# Patient Record
Sex: Male | Born: 1935 | Race: White | Hispanic: No | Smoking: Former smoker
Health system: Southern US, Community
[De-identification: ages and names within clinical notes are randomized; demographics above are authoritative.]

## PROBLEM LIST (undated history)

## (undated) DIAGNOSIS — C61 Malignant neoplasm of prostate: Secondary | ICD-10-CM

## (undated) DIAGNOSIS — I2510 Atherosclerotic heart disease of native coronary artery without angina pectoris: Secondary | ICD-10-CM

## (undated) DIAGNOSIS — C189 Malignant neoplasm of colon, unspecified: Secondary | ICD-10-CM

## (undated) DIAGNOSIS — I4891 Unspecified atrial fibrillation: Secondary | ICD-10-CM

## (undated) DIAGNOSIS — M48062 Spinal stenosis, lumbar region with neurogenic claudication: Secondary | ICD-10-CM

## (undated) DIAGNOSIS — M4722 Other spondylosis with radiculopathy, cervical region: Secondary | ICD-10-CM

## (undated) DIAGNOSIS — C642 Malignant neoplasm of left kidney, except renal pelvis: Secondary | ICD-10-CM

## (undated) DIAGNOSIS — M4712 Other spondylosis with myelopathy, cervical region: Secondary | ICD-10-CM

## (undated) DIAGNOSIS — I10 Essential (primary) hypertension: Secondary | ICD-10-CM

## (undated) HISTORY — PX: COLECTOMY: SHX59

## (undated) HISTORY — PX: HERNIA REPAIR: SHX51

## (undated) HISTORY — PX: CORONARY ARTERY BYPASS GRAFT: SHX141

## (undated) HISTORY — PX: NASAL SINUS SURGERY: SHX719

---

## 1989-10-30 DIAGNOSIS — C189 Malignant neoplasm of colon, unspecified: Secondary | ICD-10-CM

## 1989-10-30 HISTORY — DX: Malignant neoplasm of colon, unspecified: C18.9

## 2015-12-29 DIAGNOSIS — C642 Malignant neoplasm of left kidney, except renal pelvis: Secondary | ICD-10-CM | POA: Insufficient documentation

## 2015-12-29 HISTORY — DX: Malignant neoplasm of left kidney, except renal pelvis: C64.2

## 2016-01-25 HISTORY — PX: PARTIAL NEPHRECTOMY: SHX414

## 2016-06-01 DIAGNOSIS — R200 Anesthesia of skin: Secondary | ICD-10-CM | POA: Insufficient documentation

## 2016-06-27 DIAGNOSIS — M48062 Spinal stenosis, lumbar region with neurogenic claudication: Secondary | ICD-10-CM

## 2016-06-27 HISTORY — DX: Spinal stenosis, lumbar region with neurogenic claudication: M48.062

## 2016-07-12 DIAGNOSIS — M4712 Other spondylosis with myelopathy, cervical region: Secondary | ICD-10-CM

## 2016-07-12 DIAGNOSIS — M4722 Other spondylosis with radiculopathy, cervical region: Secondary | ICD-10-CM

## 2016-07-12 HISTORY — PX: POSTERIOR CERVICAL LAMINECTOMY: SHX2248

## 2016-07-12 HISTORY — DX: Other spondylosis with myelopathy, cervical region: M47.12

## 2016-07-12 HISTORY — PX: CERVICAL FUSION: SHX112

## 2016-07-14 DIAGNOSIS — I25810 Atherosclerosis of coronary artery bypass graft(s) without angina pectoris: Secondary | ICD-10-CM | POA: Insufficient documentation

## 2016-07-14 DIAGNOSIS — R1312 Dysphagia, oropharyngeal phase: Secondary | ICD-10-CM | POA: Insufficient documentation

## 2016-07-23 ENCOUNTER — Inpatient Hospital Stay (HOSPITAL_COMMUNITY): Payer: Medicare Other

## 2016-07-23 ENCOUNTER — Inpatient Hospital Stay (HOSPITAL_BASED_OUTPATIENT_CLINIC_OR_DEPARTMENT_OTHER)
Admission: EM | Admit: 2016-07-23 | Discharge: 2016-07-27 | DRG: 389 | Disposition: A | Discharge status: Home Health | Payer: Medicare Other | Attending: Nephrology | Admitting: Nephrology

## 2016-07-23 ENCOUNTER — Emergency Department (HOSPITAL_BASED_OUTPATIENT_CLINIC_OR_DEPARTMENT_OTHER): Payer: Medicare Other

## 2016-07-23 ENCOUNTER — Encounter (HOSPITAL_BASED_OUTPATIENT_CLINIC_OR_DEPARTMENT_OTHER): Payer: Self-pay | Admitting: Emergency Medicine

## 2016-07-23 DIAGNOSIS — C642 Malignant neoplasm of left kidney, except renal pelvis: Secondary | ICD-10-CM

## 2016-07-23 DIAGNOSIS — I482 Chronic atrial fibrillation, unspecified: Secondary | ICD-10-CM

## 2016-07-23 DIAGNOSIS — Z7982 Long term (current) use of aspirin: Secondary | ICD-10-CM | POA: Diagnosis not present

## 2016-07-23 DIAGNOSIS — Z85528 Personal history of other malignant neoplasm of kidney: Secondary | ICD-10-CM

## 2016-07-23 DIAGNOSIS — Z85038 Personal history of other malignant neoplasm of large intestine: Secondary | ICD-10-CM | POA: Diagnosis not present

## 2016-07-23 DIAGNOSIS — Z8546 Personal history of malignant neoplasm of prostate: Secondary | ICD-10-CM | POA: Diagnosis not present

## 2016-07-23 DIAGNOSIS — Z87891 Personal history of nicotine dependence: Secondary | ICD-10-CM | POA: Diagnosis not present

## 2016-07-23 DIAGNOSIS — I10 Essential (primary) hypertension: Secondary | ICD-10-CM | POA: Diagnosis present

## 2016-07-23 DIAGNOSIS — I480 Paroxysmal atrial fibrillation: Secondary | ICD-10-CM | POA: Diagnosis present

## 2016-07-23 DIAGNOSIS — E860 Dehydration: Secondary | ICD-10-CM | POA: Diagnosis present

## 2016-07-23 DIAGNOSIS — K565 Intestinal adhesions [bands] with obstruction (postprocedural) (postinfection): Principal | ICD-10-CM | POA: Diagnosis present

## 2016-07-23 DIAGNOSIS — Z79899 Other long term (current) drug therapy: Secondary | ICD-10-CM

## 2016-07-23 DIAGNOSIS — N179 Acute kidney failure, unspecified: Secondary | ICD-10-CM | POA: Diagnosis present

## 2016-07-23 DIAGNOSIS — I2510 Atherosclerotic heart disease of native coronary artery without angina pectoris: Secondary | ICD-10-CM | POA: Insufficient documentation

## 2016-07-23 DIAGNOSIS — K56609 Unspecified intestinal obstruction, unspecified as to partial versus complete obstruction: Secondary | ICD-10-CM

## 2016-07-23 DIAGNOSIS — K8020 Calculus of gallbladder without cholecystitis without obstruction: Secondary | ICD-10-CM | POA: Insufficient documentation

## 2016-07-23 DIAGNOSIS — K5669 Other intestinal obstruction: Secondary | ICD-10-CM | POA: Diagnosis not present

## 2016-07-23 DIAGNOSIS — Z0189 Encounter for other specified special examinations: Secondary | ICD-10-CM

## 2016-07-23 DIAGNOSIS — Z951 Presence of aortocoronary bypass graft: Secondary | ICD-10-CM

## 2016-07-23 DIAGNOSIS — R112 Nausea with vomiting, unspecified: Secondary | ICD-10-CM | POA: Diagnosis present

## 2016-07-23 DIAGNOSIS — Z981 Arthrodesis status: Secondary | ICD-10-CM | POA: Diagnosis not present

## 2016-07-23 DIAGNOSIS — R262 Difficulty in walking, not elsewhere classified: Secondary | ICD-10-CM

## 2016-07-23 HISTORY — DX: Other spondylosis with radiculopathy, cervical region: M47.22

## 2016-07-23 HISTORY — DX: Other spondylosis with myelopathy, cervical region: M47.12

## 2016-07-23 HISTORY — DX: Spinal stenosis, lumbar region with neurogenic claudication: M48.062

## 2016-07-23 HISTORY — DX: Malignant neoplasm of left kidney, except renal pelvis: C64.2

## 2016-07-23 HISTORY — DX: Malignant neoplasm of prostate: C61

## 2016-07-23 HISTORY — DX: Unspecified atrial fibrillation: I48.91

## 2016-07-23 HISTORY — DX: Essential (primary) hypertension: I10

## 2016-07-23 HISTORY — DX: Malignant neoplasm of colon, unspecified: C18.9

## 2016-07-23 HISTORY — DX: Atherosclerotic heart disease of native coronary artery without angina pectoris: I25.10

## 2016-07-23 LAB — COMPREHENSIVE METABOLIC PANEL
ALBUMIN: 2.9 g/dL — AB (ref 3.5–5.0)
ALK PHOS: 100 U/L (ref 38–126)
ALT: 51 U/L (ref 17–63)
ANION GAP: 13 (ref 5–15)
AST: 49 U/L — ABNORMAL HIGH (ref 15–41)
BILIRUBIN TOTAL: 0.8 mg/dL (ref 0.3–1.2)
BUN: 30 mg/dL — ABNORMAL HIGH (ref 6–20)
CALCIUM: 8.9 mg/dL (ref 8.9–10.3)
CO2: 23 mmol/L (ref 22–32)
Chloride: 97 mmol/L — ABNORMAL LOW (ref 101–111)
Creatinine, Ser: 1.62 mg/dL — ABNORMAL HIGH (ref 0.61–1.24)
GFR calc Af Amer: 45 mL/min — ABNORMAL LOW (ref >60)
GFR calc non Af Amer: 39 mL/min — ABNORMAL LOW (ref >60)
GLUCOSE: 116 mg/dL — AB (ref 65–99)
Potassium: 4.8 mmol/L (ref 3.5–5.1)
Sodium: 133 mmol/L — ABNORMAL LOW (ref 135–145)
TOTAL PROTEIN: 6.7 g/dL (ref 6.5–8.1)

## 2016-07-23 LAB — CBC WITH DIFFERENTIAL/PLATELET
BASOS ABS: 0 10*3/uL (ref 0.0–0.1)
BASOS PCT: 0 %
EOS PCT: 0 %
Eosinophils Absolute: 0 10*3/uL (ref 0.0–0.7)
HEMATOCRIT: 31.4 % — AB (ref 39.0–52.0)
Hemoglobin: 10.2 g/dL — ABNORMAL LOW (ref 13.0–17.0)
Lymphocytes Relative: 4 %
Lymphs Abs: 0.6 10*3/uL — ABNORMAL LOW (ref 0.7–4.0)
MCH: 26.5 pg (ref 26.0–34.0)
MCHC: 32.5 g/dL (ref 30.0–36.0)
MCV: 81.6 fL (ref 78.0–100.0)
MONO ABS: 0.9 10*3/uL (ref 0.1–1.0)
Monocytes Relative: 6 %
NEUTROS ABS: 13.6 10*3/uL — AB (ref 1.7–7.7)
Neutrophils Relative %: 90 %
PLATELETS: 550 10*3/uL — AB (ref 150–400)
RBC: 3.85 MIL/uL — ABNORMAL LOW (ref 4.22–5.81)
RDW: 15.7 % — AB (ref 11.5–15.5)
WBC: 15.1 10*3/uL — ABNORMAL HIGH (ref 4.0–10.5)

## 2016-07-23 LAB — LIPASE, BLOOD: Lipase: 29 U/L (ref 11–51)

## 2016-07-23 MED ORDER — SODIUM CHLORIDE 0.9 % IV SOLN
8.0000 mg | Freq: Four times a day (QID) | INTRAVENOUS | Status: DC | PRN
  Filled 2016-07-23: qty 4

## 2016-07-23 MED ORDER — ACETAMINOPHEN 650 MG RE SUPP
650.0000 mg | Freq: Four times a day (QID) | RECTAL | Status: DC | PRN
  Administered 2016-07-23 – 2016-07-25 (×2): 650 mg via RECTAL
  Filled 2016-07-23 (×3): qty 1

## 2016-07-23 MED ORDER — METOPROLOL TARTRATE 5 MG/5ML IV SOLN: 5.0000 mg | Freq: Four times a day (QID) | INTRAVENOUS | Status: DC | PRN

## 2016-07-23 MED ORDER — LIP MEDEX EX OINT
1.0000 "application " | TOPICAL_OINTMENT | Freq: Two times a day (BID) | CUTANEOUS | Status: DC
  Administered 2016-07-23 – 2016-07-27 (×6): 1 via TOPICAL
  Filled 2016-07-23: qty 7

## 2016-07-23 MED ORDER — BISACODYL 10 MG RE SUPP: 10.0000 mg | Freq: Two times a day (BID) | RECTAL | Status: DC | PRN

## 2016-07-23 MED ORDER — PHENOL 1.4 % MT LIQD: 2.0000 | OROMUCOSAL | Status: DC | PRN

## 2016-07-23 MED ORDER — SODIUM CHLORIDE 0.9 % IV SOLN
INTRAVENOUS | Status: AC
  Administered 2016-07-23: 16:00:00 via INTRAVENOUS

## 2016-07-23 MED ORDER — SODIUM CHLORIDE 0.9 % IV SOLN
Freq: Once | INTRAVENOUS | Status: AC
  Administered 2016-07-23: 12:00:00 via INTRAVENOUS

## 2016-07-23 MED ORDER — LACTATED RINGERS IV BOLUS (SEPSIS): 1000.0000 mL | Freq: Three times a day (TID) | INTRAVENOUS | Status: AC | PRN

## 2016-07-23 MED ORDER — PROCHLORPERAZINE EDISYLATE 5 MG/ML IJ SOLN: 5.0000 mg | INTRAMUSCULAR | Status: DC | PRN

## 2016-07-23 MED ORDER — MENTHOL 3 MG MT LOZG: 1.0000 | LOZENGE | OROMUCOSAL | Status: DC | PRN

## 2016-07-23 MED ORDER — IOPAMIDOL (ISOVUE-300) INJECTION 61%
75.0000 mL | Freq: Once | INTRAVENOUS | Status: AC | PRN
  Administered 2016-07-23: 75 mL via INTRAVENOUS

## 2016-07-23 MED ORDER — MAGIC MOUTHWASH
15.0000 mL | Freq: Four times a day (QID) | ORAL | Status: DC | PRN
  Filled 2016-07-23: qty 15

## 2016-07-23 MED ORDER — DIATRIZOATE MEGLUMINE & SODIUM 66-10 % PO SOLN
90.0000 mL | Freq: Once | ORAL | Status: AC
  Administered 2016-07-23: 90 mL via NASOGASTRIC
  Filled 2016-07-23: qty 90

## 2016-07-23 MED ORDER — ENALAPRILAT 1.25 MG/ML IV SOLN
0.6250 mg | Freq: Four times a day (QID) | INTRAVENOUS | Status: DC | PRN
  Filled 2016-07-23: qty 1

## 2016-07-23 MED ORDER — SODIUM CHLORIDE 0.9 % IV BOLUS (SEPSIS)
1000.0000 mL | Freq: Once | INTRAVENOUS | Status: AC
  Administered 2016-07-23: 1000 mL via INTRAVENOUS

## 2016-07-23 MED ORDER — FENTANYL CITRATE (PF) 100 MCG/2ML IJ SOLN
12.5000 ug | Freq: Once | INTRAMUSCULAR | Status: AC
  Administered 2016-07-23: 100 ug via INTRAVENOUS
  Filled 2016-07-23: qty 2

## 2016-07-23 MED ORDER — METOPROLOL TARTRATE 5 MG/5ML IV SOLN
5.0000 mg | Freq: Four times a day (QID) | INTRAVENOUS | Status: DC
  Administered 2016-07-23 – 2016-07-24 (×3): 5 mg via INTRAVENOUS
  Filled 2016-07-23 (×3): qty 5

## 2016-07-23 MED ORDER — ONDANSETRON HCL 4 MG/2ML IJ SOLN
4.0000 mg | Freq: Four times a day (QID) | INTRAMUSCULAR | Status: DC | PRN
  Administered 2016-07-25: 4 mg via INTRAVENOUS
  Filled 2016-07-23: qty 2

## 2016-07-23 MED ORDER — ONDANSETRON HCL 4 MG/2ML IJ SOLN: 4.0000 mg | Freq: Three times a day (TID) | INTRAMUSCULAR | Status: DC | PRN

## 2016-07-23 MED ORDER — ENOXAPARIN SODIUM 40 MG/0.4ML ~~LOC~~ SOLN
40.0000 mg | SUBCUTANEOUS | Status: DC
  Administered 2016-07-23 – 2016-07-26 (×4): 40 mg via SUBCUTANEOUS
  Filled 2016-07-23 (×4): qty 0.4

## 2016-07-23 MED ORDER — ONDANSETRON HCL 4 MG/2ML IJ SOLN
4.0000 mg | Freq: Once | INTRAMUSCULAR | Status: AC
  Administered 2016-07-23: 4 mg via INTRAVENOUS
  Filled 2016-07-23: qty 2

## 2016-07-23 MED ORDER — FENTANYL CITRATE (PF) 100 MCG/2ML IJ SOLN
12.5000 ug | INTRAMUSCULAR | Status: DC | PRN
  Administered 2016-07-26: 12.5 ug via INTRAVENOUS
  Filled 2016-07-23: qty 2

## 2016-07-23 MED ORDER — LACTATED RINGERS IV SOLN
INTRAVENOUS | Status: DC
  Administered 2016-07-23: via INTRAVENOUS

## 2016-07-23 MED ORDER — ALUM & MAG HYDROXIDE-SIMETH 200-200-20 MG/5ML PO SUSP: 30.0000 mL | Freq: Four times a day (QID) | ORAL | Status: DC | PRN

## 2016-07-23 MED ORDER — METHOCARBAMOL 1000 MG/10ML IJ SOLN
1000.0000 mg | Freq: Four times a day (QID) | INTRAVENOUS | Status: DC | PRN
  Administered 2016-07-25: 1000 mg via INTRAVENOUS
  Filled 2016-07-23 (×4): qty 10

## 2016-07-23 MED ORDER — FENTANYL CITRATE (PF) 100 MCG/2ML IJ SOLN: 50.0000 ug | Freq: Once | INTRAMUSCULAR | Status: DC

## 2016-07-23 MED ORDER — DIPHENHYDRAMINE HCL 50 MG/ML IJ SOLN
12.5000 mg | Freq: Four times a day (QID) | INTRAMUSCULAR | Status: DC | PRN
  Administered 2016-07-24: 25 mg via INTRAVENOUS
  Filled 2016-07-23: qty 1

## 2016-07-23 NOTE — Progress Notes (Signed)
Transfer from [place]  Patient is a 80 year old male with a history of colon cancer, prostate cancer, paroxysmal atrial fibrillation, CAD found to have small bowel obstruction likely secondary to adhesions. NG tube placed in the ED. Dr. gross, general surgery, has been consulted and will see on arrival to [facility] once paged. Continue nothing by mouth and NG tube. Admit to medical floor.   Cordelia Poche, MD [facility] 07/23/2016, 1:10 PM Pager: [phone]

## 2016-07-23 NOTE — H&P (Signed)
History and Physical    Ricky Pierce [id number] DOB: 12/27/1935 DOA: 07/23/2016  PCP: Pcp Not In System Patient coming from: Home  Chief Complaint: Nausea and vomiting  HPI: Ricky Pierce is a 80 y.o. male with medical history significant of paroxysmal atrial fibrillation, hypertension, hx of colon cancer, hx of prostate cancer, hx of renal cell carcinoma, CAD s/p CABG. Patient reports a one day history of severe vomiting. Symptoms worsened overnight into this morning. Emesis is bilious and non-bloody with fecal matter. There are no triggers to the emesis. He has not tried anything for his vomiting. Last bowel movement two days ago. He is passing gas.  ED Course: Vitals: Tachycardia, afebrile Labs: Elevated creatinine of 1.62. Elevated WBC of 15.1k Imaging: CT significant for small bowel obstruction, cholelithiasis and aortoiliac atherosclerosis Medications/Course: Fentanyl for pain  Review of Systems: Review of Systems  Cardiovascular: Negative for chest pain, palpitations and orthopnea.  Gastrointestinal: Positive for vomiting. Negative for abdominal pain, blood in stool, constipation, diarrhea and nausea.  Genitourinary: Negative for dysuria, frequency, hematuria and urgency.  Musculoskeletal: Positive for neck pain.    Past Medical History:  Diagnosis Date  . Atrial fibrillation ([place])   . Cervical spondylosis with myelopathy and radiculopathy 07/12/2016   Last Assessment & Plan:  Cervical spondylosis with myelopathy and radiculopathy. Surgery on 07/12/16 by Dr. Guinevere Ferrari went well.  Plan: Improving but needs rehab.  PRN pain medication.  . Colon cancer (Dx 1991) 1991   proximal "right" side  . Coronary artery disease   . Hypertension   . Prostate cancer ([place])   . Renal cell carcinoma of left kidney 12/2015   left partial nephrectomy  . Spinal stenosis of lumbar region with neurogenic claudication 06/27/2016   Last Assessment & Plan:  L3-4 and L4-5 spinal stenosis most severe at  L4-5.  This is due to underlying degenerative disc disease and spondylosis with facet arthropathy and ligamentum flavum hypertrophy.  He will likely need a two-level L3 and L4 laminectomy in the future to decompress the spinal axis here.  I have advised him to undergo the cervical procedure first and when he is cleared from that then we will bring him back for his lumbar decompression.    Past Surgical History:  Procedure Laterality Date  . CERVICAL FUSION  07/12/2016   [place], [place].  Dr Guinevere Ferrari.  STAGE 1: Cervical 3-4, 4-5, 5-6 and 6-7 Anterior Cervical Diskectomy Fusion Globus XTEND Cervical Plate; STAGE 2: Cervical 3-7 Laminectomies with Posterior Lateral Fusion with Instrumentation. Globus Quartex and Kinex  . COLECTOMY Right    colon cancer proximal/"right"  . CORONARY ARTERY BYPASS GRAFT    . HERNIA REPAIR    . NASAL SINUS SURGERY    . PARTIAL NEPHRECTOMY Left 01/25/2016  . POSTERIOR CERVICAL LAMINECTOMY  07/12/2016   2 steel rods and a plate     reports that he has quit smoking. His smoking use included Cigarettes. He has never used smokeless tobacco. He reports that he drinks about 1.2 - 1.8 oz of alcohol per week . He reports that he does not use drugs.  Allergies  Allergen Reactions  . Sulfa Antibiotics Hives    Family History  Problem Relation Age of Onset  . Heart attack Mother   . Cancer Father   . Heart attack Father   . Cancer Sister   . Cancer Sister     Prior to Admission medications   Medication Sig Start Date End Date Taking? Authorizing Provider  aspirin 81 MG  chewable tablet Chew by mouth daily.   Yes Historical Provider, MD  cyclobenzaprine (FLEXERIL) 10 MG tablet Take 10 mg by mouth 2 (two) times daily.    Yes Historical Provider, MD  fexofenadine (ALLEGRA) 180 MG tablet Take 180 mg by mouth daily.   Yes Historical Provider, MD  lisinopril (PRINIVIL,ZESTRIL) 10 MG tablet Take 10 mg by mouth daily.   Yes Historical Provider, MD  metoprolol succinate  (TOPROL-XL) 50 MG 24 hr tablet Take 50 mg by mouth 2 (two) times daily. Take with or immediately following a meal.   Yes Historical Provider, MD  montelukast (SINGULAIR) 10 MG tablet Take 10 mg by mouth at bedtime.   Yes Historical Provider, MD  omeprazole (PRILOSEC) 20 MG capsule Take 20 mg by mouth 2 (two) times daily before a meal.   Yes Historical Provider, MD  oxyCODONE-acetaminophen (PERCOCET/ROXICET) 5-325 MG tablet Take by mouth every 8 (eight) hours as needed for severe pain.   Yes Historical Provider, MD  rosuvastatin (CRESTOR) 40 MG tablet Take 40 mg by mouth daily.   Yes Historical Provider, MD    Physical Exam: Vitals:   07/23/16 1436 07/23/16 1530 07/23/16 1801 07/23/16 1852  BP: 127/56 (!) 118/55  (!) 138/46  Pulse: (!) 121 (!) 125  (!) 119  Resp: 18 16  18   Temp: 98.5 F (36.9 C) 97.7 F (36.5 C)  98.5 F (36.9 C)  TempSrc: Oral Oral  Oral  SpO2: 100% 100%  99%  Weight:   70.5 kg (155 lb 6.8 oz)   Height:   5\' 4"  (1.626 m)      Constitutional: NAD, calm, comfortable Eyes: PERRL, lids and conjunctivae normal ENMT: Mucous membranes are moist. Posterior pharynx clear of any exudate or lesions. Neck collar Neck: normal, supple, no masses Respiratory: clear to auscultation bilaterally, no wheezing, no crackles. Normal respiratory effort. No accessory muscle use.  Cardiovascular: Increased rate and regular rhythm, no murmurs / rubs / gallops. No extremity edema. 2+ pedal pulses. No carotid bruits.  Abdomen: no tenderness, no masses palpated. Mild distention but soft. No hepatosplenomegaly. Bowel sounds positive.  Musculoskeletal: no clubbing / cyanosis. No joint deformity upper and lower extremities. Good ROM, no contractures. Normal muscle tone.  Skin: no rashes, lesions, ulcers. No induration Neurologic: CN 2-12 grossly intact. Sensation intact, DTR normal. Strength 5/5 in all 4.  Psychiatric: Normal judgment and insight. Alert and oriented x 3. Normal mood.   Labs on  Admission: I have personally reviewed following labs and imaging studies  CBC:  Recent Labs Lab 07/23/16 0830  WBC 15.1*  NEUTROABS 13.6*  HGB 10.2*  HCT 31.4*  MCV 81.6  PLT [id number]*   Basic Metabolic Panel:  Recent Labs Lab 07/23/16 0830  NA 133*  K 4.8  CL 97*  CO2 23  GLUCOSE 116*  BUN 30*  CREATININE 1.62*  CALCIUM 8.9   GFR: Estimated Creatinine Clearance: 31.5 mL/min (by C-G formula based on SCr of 1.62 mg/dL (H)). Liver Function Tests:  Recent Labs Lab 07/23/16 0830  AST 49*  ALT 51  ALKPHOS 100  BILITOT 0.8  PROT 6.7  ALBUMIN 2.9*    Recent Labs Lab 07/23/16 0830  LIPASE 29   Radiological Exams on Admission: Ct Abdomen Pelvis W Contrast  Result Date: 07/23/2016 CLINICAL DATA:  N/V 2300 last pm, had AAS today showing possible obstruction, last bm 2 days ago, recent cervical spine surgery, pt in cervical collar, unable to raise arms d/t surgical restrictionsReduced dose per protocol, pt  with partial left nephrectomy 01/25/16 for renal cancer, followed by nephrologistHx prostate cancer, colon cancer and renal cancer, colectomy EXAM: CT ABDOMEN AND PELVIS WITH CONTRAST TECHNIQUE: Multidetector CT imaging of the abdomen and pelvis was performed using the standard protocol following bolus administration of intravenous contrast. CONTRAST:  46mL ISOVUE-300 IOPAMIDOL (ISOVUE-300) INJECTION 61% COMPARISON:  Radiographs from earlier the same day FINDINGS: Lower chest: Previous median sternotomy. Coronary calcifications. No pleural or pericardial effusion. Subpleural sub cm nodular opacities posteriorly in the visualized right lung base. Elevated left diaphragmatic leaflet with some adjacent subsegmental atelectasis at the left lung base. Hepatobiliary: Negative liver. Sub cm soft tissue attenuation density in the dependent aspect of the gallbladder lumen probably small calculi. Pancreas: Unremarkable. No pancreatic ductal dilatation or surrounding inflammatory changes.  Spleen: Normal in size without focal abnormality. Adrenals/Urinary Tract: Normal adrenals. Normal right kidney. Focal parenchymal loss in the lower pole left kidney. No solid renal mass or hydronephrosis. No urolithiasis. Urinary bladder physiologically distended containing small amount of gas presumably from recent instrumentation. Stomach/Bowel: Stomach is mildly distended. Dilated proximal and mid small bowel loops without wall thickening. Abrupt transition to decompressed ileum in the right lower quadrant, with no associated mass or abscess. Distal ileum unremarkable. Probable partial right hemicolectomy. The colon is nondilated. Vascular/Lymphatic: Coarse aortoiliac arterial calcifications without aneurysm. No adenopathy localized. Reproductive: Mild prostatic enlargement with central coarse calcifications. Other: No ascites.  No free air. Musculoskeletal: No acute or significant osseous findings. IMPRESSION: 1. Distal small bowel obstruction without associated wall thickening, mass, hernia, or abscess suggesting adhesions as etiology. 2. Cholelithiasis 3. Aortoiliac atherosclerosis. Electronically Signed   By: Lucrezia Europe M.D.   On: 07/23/2016 10:22   Dg Abd Acute W/chest  Result Date: 07/23/2016 CLINICAL DATA:  Nausea/vomiting EXAM: DG ABDOMEN ACUTE W/ 1V CHEST COMPARISON:  Chest radiographs dated 03/01/2016 FINDINGS: Lungs are clear.  No pleural effusion or pneumothorax. Mild elevation left hemidiaphragm. The heart is normal in size. Postsurgical changes related to prior CABG. Median sternotomy. Multiple dilated loops of small bowel in the left mid abdomen, suspicious for small bowel obstruction. Surgical sutures in the right mid abdomen. No evidence of free air under the diaphragm on the upright view. Mild degenerative changes of the lumbar spine. IMPRESSION: No evidence of acute cardiopulmonary disease. Multiple dilated loops of small bowel in the left mid abdomen, suspicious for small bowel  obstruction. No free air. Electronically Signed   By: Julian Hy M.D.   On: 07/23/2016 09:26   Dg Abd Portable 1v-small Bowel Protocol-position Verification  Result Date: 07/23/2016 CLINICAL DATA:  Nasogastric tube placement.  Abdominal distention EXAM: PORTABLE ABDOMEN - 1 VIEW COMPARISON:  CT abdomen and pelvis earlier in the day FINDINGS: Nasogastric tube tip and side port are in the stomach. There are multiple loops of dilated bowel consistent with a degree of bowel obstruction. There are surgical clips the right abdomen. No free air evident. Contrast is seen in the urinary bladder. IMPRESSION: Persistent bowel dilatation in a pattern suggesting a degree of bowel obstruction. No free air. Nasogastric tube tip and side port in stomach. Note elevation of the left hemidiaphragm. Electronically Signed   By: Lowella Grip III M.D.   On: 07/23/2016 17:20    Assessment/Plan Principal Problem:   SBO (small bowel obstruction) (HCC) Active Problems:   Hypertension   Paroxysmal atrial fibrillation with RVR (HCC)   ARF (acute renal failure)    Small bowel obstruction Likely secondary to adhesions from multiple intraabdominal surgeries -NG tube -  NPO -surgery recommendations  Acute renal injury Likely secondary to dehydration -IVF -recheck BMP in AM  Atrial fibrillation Tachycardia Patient is tachycardic. Regular rhythm on exam -EKG -metoprolol IV while NPO  Hypertension -metoprolol IV while NPO  CAD Hold statin and aspirin while NPO  DVT prophylaxis: Lovenox Code Status: Full code Family Communication: Wife at bedside Disposition Plan: Likely discharge in 2-3 days Consults called: General surgery, Dr. Johney Maine Admission status: Inpatient, medical floor   Cordelia Poche, MD [facility] Pager [phone]  If 7PM-7AM, please contact night-coverage [email or web address] Password [email or web address]  07/23/2016, 7:13 PM

## 2016-07-23 NOTE — ED Provider Notes (Signed)
[facility] DEPT [facility] Provider Note   CSN: [id number] Arrival date & time: 07/23/16  0741     History   Chief Complaint Chief Complaint  Patient presents with  . Emesis  . Fall    HPI Ricky Pierce is a 80 y.o. male.  HPI Patient presents with nausea and vomiting that started around 11 PM last night.  Has vomited many times.  Describes emesis is brown.  Patient also noted some distention of his abdomen.  His recently been placed on oxycodone.  Patient had Past Medical History:  Diagnosis Date  . Atrial fibrillation ([place])   . Cervical spondylosis with myelopathy and radiculopathy 07/12/2016   Last Assessment & Plan:  Cervical spondylosis with myelopathy and radiculopathy. Surgery on 07/12/16 by Dr. Guinevere Ferrari went well.  Plan: Improving but needs rehab.  PRN pain medication.  . Colon cancer (Dx 1991) 1991   proximal "right" side  . Coronary artery disease   . Hypertension   . Prostate cancer ([place])   . Renal cell carcinoma of left kidney 12/2015   left partial nephrectomy  . Spinal stenosis of lumbar region with neurogenic claudication 06/27/2016   Last Assessment & Plan:  L3-4 and L4-5 spinal stenosis most severe at L4-5.  This is due to underlying degenerative disc disease and spondylosis with facet arthropathy and ligamentum flavum hypertrophy.  He will likely need a two-level L3 and L4 laminectomy in the future to decompress the spinal axis here.  I have advised him to undergo the cervical procedure first and when he is cleared from that then we will bring him back for his lumbar decompression.    Patient Active Problem List   Diagnosis Date Noted  . SBO (small bowel obstruction) ([place]) 07/23/2016  . ARF (acute renal failure)  07/23/2016  . Cholelithiasis 07/23/2016  . Hypertension   . Coronary artery disease   . Paroxysmal atrial fibrillation with RVR ([place])   . Coronary artery disease involving coronary bypass graft of native heart without angina pectoris 07/14/2016  .  Oropharyngeal dysphagia 07/14/2016  . Cervical spondylosis with myelopathy and radiculopathy 07/12/2016  . Spinal stenosis of lumbar region with neurogenic claudication 06/27/2016  . Leg numbness 06/01/2016  . Renal cell carcinoma of left kidney 12/29/2015  . Colon cancer (Dx 1991) 10/30/1989    Past Surgical History:  Procedure Laterality Date  . CERVICAL FUSION  07/12/2016   [place], [place].  Dr Guinevere Ferrari.  STAGE 1: Cervical 3-4, 4-5, 5-6 and 6-7 Anterior Cervical Diskectomy Fusion Globus XTEND Cervical Plate; STAGE 2: Cervical 3-7 Laminectomies with Posterior Lateral Fusion with Instrumentation. Globus Quartex and Kinex  . COLECTOMY Right    colon cancer proximal/"right"  . CORONARY ARTERY BYPASS GRAFT    . HERNIA REPAIR    . NASAL SINUS SURGERY    . PARTIAL NEPHRECTOMY Left 01/25/2016  . POSTERIOR CERVICAL LAMINECTOMY  07/12/2016   2 steel rods and a plate       Home Medications    Prior to Admission medications   Medication Sig Start Date End Date Taking? Authorizing Provider  aspirin 81 MG chewable tablet Chew by mouth daily.   Yes Historical Provider, MD  cyclobenzaprine (FLEXERIL) 10 MG tablet Take 10 mg by mouth 2 (two) times daily.    Yes Historical Provider, MD  fexofenadine (ALLEGRA) 180 MG tablet Take 180 mg by mouth daily.   Yes Historical Provider, MD  lisinopril (PRINIVIL,ZESTRIL) 10 MG tablet Take 10 mg by mouth daily.   Yes Historical Provider, MD  metoprolol succinate (TOPROL-XL) 50 MG 24 hr tablet Take 50 mg by mouth 2 (two) times daily. Take with or immediately following a meal.   Yes Historical Provider, MD  montelukast (SINGULAIR) 10 MG tablet Take 10 mg by mouth at bedtime.   Yes Historical Provider, MD  omeprazole (PRILOSEC) 20 MG capsule Take 20 mg by mouth 2 (two) times daily before a meal.   Yes Historical Provider, MD  oxyCODONE-acetaminophen (PERCOCET/ROXICET) 5-325 MG tablet Take by mouth every 8 (eight) hours as needed for severe pain.   Yes Historical  Provider, MD  rosuvastatin (CRESTOR) 40 MG tablet Take 40 mg by mouth daily.   Yes Historical Provider, MD    Family History Family History  Problem Relation Age of Onset  . Heart attack Mother   . Cancer Father   . Heart attack Father   . Cancer Sister   . Cancer Sister     Social History Social History  Substance Use Topics  . Smoking status: Former Smoker    Types: Cigarettes  . Smokeless tobacco: Never Used     Comment: quit smoking 26 yrs ago  . Alcohol use 1.2 - 1.8 oz/week    2 - 3 Cans of beer per week     Comment: only drinks during the summer     Allergies   Sulfa antibiotics   Review of Systems Review of Systems All other systems reviewed and are negative  Physical Exam Updated Vital Signs BP (!) 152/68 (BP Location: Right Arm)   Pulse 96   Temp 97.8 F (36.6 C) (Oral)   Resp 16   Ht 5\' 4"  (1.626 m)   Wt 155 lb 6.8 oz (70.5 kg)   SpO2 100%   BMI 26.68 kg/m   Physical Exam Physical Exam  Nursing note and vitals reviewed. Constitutional: He is oriented to person, place, and time. He appears well-developed and well-nourished. No distress.  HENT:  Head: Normocephalic and atraumatic.  Eyes: Pupils are equal, round, and reactive to light.  Neck: Normal range of motion.  Cardiovascular: Normal rate and intact distal pulses.   Pulmonary/Chest: No respiratory distress.  Abdominal: Normal appearance.  Patient appears distended and has increased percussion.  No focal tenderness.  Bowel sounds are diminished but present.  No rebound or guarding tenderness. Musculoskeletal: Normal range of motion.  Neurological: He is alert and oriented to person, place, and time. No cranial nerve deficit.  Skin: Skin is warm and dry. No rash noted.  Psychiatric: He has a normal mood and affect. His behavior is normal.    ED Treatments / Results  Labs (all labs ordered are listed, but only abnormal results are displayed) Labs Reviewed  COMPREHENSIVE METABOLIC PANEL -  Abnormal; Notable for the following:       Result Value   Sodium 133 (*)    Chloride 97 (*)    Glucose, Bld 116 (*)    BUN 30 (*)    Creatinine, Ser 1.62 (*)    Albumin 2.9 (*)    AST 49 (*)    GFR calc non Af Amer 39 (*)    GFR calc Af Amer 45 (*)    All other components within normal limits  CBC WITH DIFFERENTIAL/PLATELET - Abnormal; Notable for the following:    WBC 15.1 (*)    RBC 3.85 (*)    Hemoglobin 10.2 (*)    HCT 31.4 (*)    RDW 15.7 (*)    Platelets 550 (*)  Neutro Abs 13.6 (*)    Lymphs Abs 0.6 (*)    All other components within normal limits  BASIC METABOLIC PANEL - Abnormal; Notable for the following:    BUN 29 (*)    Creatinine, Ser 1.32 (*)    Calcium 8.6 (*)    GFR calc non Af Amer 50 (*)    GFR calc Af Amer 58 (*)    All other components within normal limits  CBC - Abnormal; Notable for the following:    RBC 3.71 (*)    Hemoglobin 9.7 (*)    HCT 29.3 (*)    RDW 16.1 (*)    Platelets 449 (*)    All other components within normal limits  LIPASE, BLOOD  MAGNESIUM    EKG  EKG Interpretation None       Radiology Ct Abdomen Pelvis W Contrast  Result Date: 07/23/2016 CLINICAL DATA:  N/V 2300 last pm, had AAS today showing possible obstruction, last bm 2 days ago, recent cervical spine surgery, pt in cervical collar, unable to raise arms d/t surgical restrictionsReduced dose per protocol, pt with partial left nephrectomy 01/25/16 for renal cancer, followed by nephrologistHx prostate cancer, colon cancer and renal cancer, colectomy EXAM: CT ABDOMEN AND PELVIS WITH CONTRAST TECHNIQUE: Multidetector CT imaging of the abdomen and pelvis was performed using the standard protocol following bolus administration of intravenous contrast. CONTRAST:  41mL ISOVUE-300 IOPAMIDOL (ISOVUE-300) INJECTION 61% COMPARISON:  Radiographs from earlier the same day FINDINGS: Lower chest: Previous median sternotomy. Coronary calcifications. No pleural or pericardial effusion.  Subpleural sub cm nodular opacities posteriorly in the visualized right lung base. Elevated left diaphragmatic leaflet with some adjacent subsegmental atelectasis at the left lung base. Hepatobiliary: Negative liver. Sub cm soft tissue attenuation density in the dependent aspect of the gallbladder lumen probably small calculi. Pancreas: Unremarkable. No pancreatic ductal dilatation or surrounding inflammatory changes. Spleen: Normal in size without focal abnormality. Adrenals/Urinary Tract: Normal adrenals. Normal right kidney. Focal parenchymal loss in the lower pole left kidney. No solid renal mass or hydronephrosis. No urolithiasis. Urinary bladder physiologically distended containing small amount of gas presumably from recent instrumentation. Stomach/Bowel: Stomach is mildly distended. Dilated proximal and mid small bowel loops without wall thickening. Abrupt transition to decompressed ileum in the right lower quadrant, with no associated mass or abscess. Distal ileum unremarkable. Probable partial right hemicolectomy. The colon is nondilated. Vascular/Lymphatic: Coarse aortoiliac arterial calcifications without aneurysm. No adenopathy localized. Reproductive: Mild prostatic enlargement with central coarse calcifications. Other: No ascites.  No free air. Musculoskeletal: No acute or significant osseous findings. IMPRESSION: 1. Distal small bowel obstruction without associated wall thickening, mass, hernia, or abscess suggesting adhesions as etiology. 2. Cholelithiasis 3. Aortoiliac atherosclerosis. Electronically Signed   By: Lucrezia Europe M.D.   On: 07/23/2016 10:22   Dg Abd Acute W/chest  Result Date: 07/23/2016 CLINICAL DATA:  Nausea/vomiting EXAM: DG ABDOMEN ACUTE W/ 1V CHEST COMPARISON:  Chest radiographs dated 03/01/2016 FINDINGS: Lungs are clear.  No pleural effusion or pneumothorax. Mild elevation left hemidiaphragm. The heart is normal in size. Postsurgical changes related to prior CABG. Median  sternotomy. Multiple dilated loops of small bowel in the left mid abdomen, suspicious for small bowel obstruction. Surgical sutures in the right mid abdomen. No evidence of free air under the diaphragm on the upright view. Mild degenerative changes of the lumbar spine. IMPRESSION: No evidence of acute cardiopulmonary disease. Multiple dilated loops of small bowel in the left mid abdomen, suspicious for small bowel obstruction. No  free air. Electronically Signed   By: Julian Hy M.D.   On: 07/23/2016 09:26   Dg Abd Portable 1v-small Bowel Obstruction Protocol-initial, 8 Hr Delay  Result Date: 07/24/2016 CLINICAL DATA:  8 hours after administration of oral contrast. Small bowel obstruction protocol. Initial encounter. EXAM: PORTABLE ABDOMEN - 1 VIEW COMPARISON:  Abdominal radiograph performed 07/23/2016 FINDINGS: There is dilution of contrast into the small bowel. Contrast has not progressed to the colon, and diffusely dilated small bowel loops are again noted, measuring up to 4.2 cm, compatible with persistent obstruction. Contrast is also noted within the bladder. Postoperative change is seen at the right mid abdomen. No acute osseous abnormalities are seen. IMPRESSION: Dilution of contrast into the small bowel. No evidence of progression of contrast to the colon. Diffusely dilated small bowel loops again noted, measuring up to 4.2 cm, compatible with persistent obstruction. Electronically Signed   By: Garald Balding M.D.   On: 07/24/2016 02:09   Dg Abd Portable 1v-small Bowel Protocol-position Verification  Result Date: 07/23/2016 CLINICAL DATA:  Nasogastric tube placement.  Abdominal distention EXAM: PORTABLE ABDOMEN - 1 VIEW COMPARISON:  CT abdomen and pelvis earlier in the day FINDINGS: Nasogastric tube tip and side port are in the stomach. There are multiple loops of dilated bowel consistent with a degree of bowel obstruction. There are surgical clips the right abdomen. No free air evident.  Contrast is seen in the urinary bladder. IMPRESSION: Persistent bowel dilatation in a pattern suggesting a degree of bowel obstruction. No free air. Nasogastric tube tip and side port in stomach. Note elevation of the left hemidiaphragm. Electronically Signed   By: Lowella Grip III M.D.   On: 07/23/2016 17:20    Procedures Procedures (including critical care time)  Medications Ordered in ED Medications  lactated ringers bolus 1,000 mL (not administered)  acetaminophen (TYLENOL) suppository 650 mg (650 mg Rectal Given 07/23/16 1913)  methocarbamol (ROBAXIN) 1,000 mg in dextrose 5 % 50 mL IVPB (not administered)  lip balm (CARMEX) ointment 1 application (1 application Topical Given 07/23/16 2357)  phenol (CHLORASEPTIC) mouth spray 2 spray (not administered)  menthol-cetylpyridinium (CEPACOL) lozenge 3 mg (not administered)  magic mouthwash (not administered)  alum & mag hydroxide-simeth (MAALOX/MYLANTA) 200-200-20 MG/5ML suspension 30 mL (not administered)  bisacodyl (DULCOLAX) suppository 10 mg (not administered)  prochlorperazine (COMPAZINE) injection 5-10 mg (not administered)  ondansetron (ZOFRAN) injection 4 mg (not administered)    Or  ondansetron (ZOFRAN) 8 mg in sodium chloride 0.9 % 50 mL IVPB (not administered)  metoprolol (LOPRESSOR) injection 5 mg (not administered)  diphenhydrAMINE (BENADRYL) injection 12.5-25 mg (not administered)  metoprolol (LOPRESSOR) injection 5 mg (5 mg Intravenous Given 07/24/16 0606)  enalaprilat (VASOTEC) injection 0.625-1.25 mg (not administered)  enoxaparin (LOVENOX) injection 40 mg (40 mg Subcutaneous Given 07/23/16 2114)  fentaNYL (SUBLIMAZE) injection 12.5 mcg (not administered)  lactated ringers infusion ( Intravenous New Bag/Given 07/23/16 2349)  sodium chloride 0.9 % bolus 1,000 mL (0 mLs Intravenous Stopped 07/23/16 1222)  ondansetron (ZOFRAN) injection 4 mg (4 mg Intravenous Given 07/23/16 0903)  iopamidol (ISOVUE-300) 61 % injection 75 mL  (75 mLs Intravenous Contrast Given 07/23/16 0950)  0.9 %  sodium chloride infusion ( Intravenous New Bag/Given 07/23/16 1219)  diatrizoate meglumine-sodium (GASTROGRAFIN) 66-10 % solution 90 mL (90 mLs Per NG tube Given by Other 07/23/16 1726)  0.9 %  sodium chloride infusion ( Intravenous New Bag/Given 07/23/16 1543)  fentaNYL (SUBLIMAZE) injection 12.5 mcg (100 mcg Intravenous Given 07/23/16 1337)  Initial Impression / Assessment and Plan / ED Course  I have reviewed the triage vital signs and the nursing notes.  Pertinent labs & imaging results that were available during my care of the patient were reviewed by me and considered in my medical decision making (see chart for details).  Clinical Course  NG was placed and [phone] cc of foul-smelling material was obtained.  Patient was admitted to the hospitalist service after discussing with general surgery.  Patient appeared stable for transfer to [facility].    Final Clinical Impressions(s) / ED Diagnoses   Final diagnoses:  SBO (small bowel obstruction) [facility])    New Prescriptions Current Discharge Medication List       Leonard Schwartz, MD 07/24/16 [id number]

## 2016-07-23 NOTE — Consult Note (Signed)
[facility]  [address] [place], [place] [address] Phone: [phone] FAX: [phone]     Omarii Scalzo  10/06/1936 [id number]  CARE TEAM:  PCP: Pcp Not In System  Outpatient Care Team: Patient Care Team: Pcp Not In System as PCP - General  Inpatient Treatment Team: Treatment Team: Attending Provider: Leonard Schwartz, MD; Registered Nurse: Radene Journey, RN; Registered Nurse: Glenard Haring, RN  This patient is a 80 y.o.male who presents today for surgical evaluation at the request of Dr Audie Pinto, [facility] ED MD.   Reason for evaluation: SBO  Patient s/p cervical spine surgery 07/12/2016 in [place] with transition to rehab on oral narcotics.  Worsening nausea/vomiting, becoming brown.  Worsening Over the past 24 hours.  Sent to [facility] ED.   Workup suspicious for small bowel obstruction - surgical consult requested.   History of what looks like a proximal right colectomy.  Apparently had colon cancer in 1991.  Also had resection of renal cell cancer on lower pole of left kidney her earliest year.  March 2017.  Treatment at outside hospitals.  Looks like mainly in the [place] region.Does not look like he has never been really in the [facility] system.  Past Medical History:  Diagnosis Date  . Atrial fibrillation ([place])   . Cervical spondylosis with myelopathy and radiculopathy 07/12/2016   Last Assessment & Plan:  Cervical spondylosis with myelopathy and radiculopathy. Surgery on 07/12/16 by Dr. Guinevere Ferrari went well.  Plan: Improving but needs rehab.  PRN pain medication.  . Colon cancer (Dx 1991) 1991  . Coronary artery disease   . Hypertension   . Prostate cancer ([place])   . Renal cell carcinoma of left kidney 12/2015   left partial nephrectomy  . Spinal stenosis of lumbar region with neurogenic claudication 06/27/2016   Last Assessment & Plan:  L3-4 and L4-5 spinal stenosis most severe at L4-5.  This is due to underlying degenerative  disc disease and spondylosis with facet arthropathy and ligamentum flavum hypertrophy.  He will likely need a two-level L3 and L4 laminectomy in the future to decompress the spinal axis here.  I have advised him to undergo the cervical procedure first and when he is cleared from that then we will bring him back for his lumbar decompression.    Past Surgical History:  Procedure Laterality Date  . CERVICAL FUSION  07/12/2016   [place], [place].  Dr Guinevere Ferrari.  STAGE 1: Cervical 3-4, 4-5, 5-6 and 6-7 Anterior Cervical Diskectomy Fusion Globus XTEND Cervical Plate; STAGE 2: Cervical 3-7 Laminectomies with Posterior Lateral Fusion with Instrumentation. Globus Quartex and Kinex  . COLECTOMY Right    colon cancer proximal/"right"  . CORONARY ARTERY BYPASS GRAFT    . HERNIA REPAIR    . NASAL SINUS SURGERY    . PARTIAL NEPHRECTOMY Left 01/25/2016  . POSTERIOR CERVICAL LAMINECTOMY  07/12/2016   2 steel rods and a plate    Social History   Social History  . Marital status: Divorced    Spouse name: N/A  . Number of children: N/A  . Years of education: N/A   Occupational History  . Not on file.   Social History Main Topics  . Smoking status: Not on file  . Smokeless tobacco: Not on file  . Alcohol use Not on file  . Drug use: Unknown  . Sexual activity: Not on file   Other Topics Concern  . Not on file   Social History Narrative  .  No narrative on file    No family history on file.  No current facility-administered medications for this encounter.    Current Outpatient Prescriptions  Medication Sig Dispense Refill  . aspirin 81 MG chewable tablet Chew by mouth daily.    . cyclobenzaprine (FLEXERIL) 10 MG tablet Take 10 mg by mouth 3 (three) times daily.    . fexofenadine (ALLEGRA) 180 MG tablet Take 180 mg by mouth daily.    Marland Kitchen lisinopril (PRINIVIL,ZESTRIL) 10 MG tablet Take 10 mg by mouth daily.    . metoprolol succinate (TOPROL-XL) 50 MG 24 hr tablet Take 50 mg by mouth 2 (two) times  daily. Take with or immediately following a meal.    . montelukast (SINGULAIR) 10 MG tablet Take 10 mg by mouth at bedtime.    Marland Kitchen omeprazole (PRILOSEC) 20 MG capsule Take 20 mg by mouth 2 (two) times daily before a meal.    . oxyCODONE-acetaminophen (PERCOCET/ROXICET) 5-325 MG tablet Take by mouth every 8 (eight) hours as needed for severe pain.    . rosuvastatin (CRESTOR) 40 MG tablet Take 40 mg by mouth daily.       Allergies  Allergen Reactions  . Sulfa Antibiotics Hives    ROS: Constitutional:  No fevers, chills, sweats.  Weight stable Eyes:  No vision changes, No discharge HENT:  No sore throats, nasal drainage Lymph: No neck swelling, No bruising easily Pulmonary:  No cough, productive sputum CV: No orthopnea, PND  Patient walks 20 minutes for about 1 miles without difficulty.  No exertional chest/neck/shoulder/arm pain. GI: No personal nor family history of inflammatory bowel disease, irritable bowel syndrome, allergy such as Celiac Sprue, dietary/dairy problems, colitis, ulcers nor gastritis.  No recent sick contacts/gastroenteritis.  No travel outside the country.  No changes in diet. Renal: No UTIs, No hematuria Genital:  No drainage, bleeding, masses Musculoskeletal: No severe joint pain.  Good ROM major joints Skin:  No sores or lesions.  No rashes Heme/Lymph:  No easy bleeding.  No swollen lymph nodes Neuro: No focal weakness/numbness.  No seizures Psych: No suicidal ideation.  No hallucinations  BP 135/77 (BP Location: Left Arm)   Pulse 118   Temp 97.9 F (36.6 C) (Oral)   Resp 18   Ht 5' 4"  (1.626 m)   Wt 72.6 kg (160 lb)   SpO2 95%   BMI 27.46 kg/m   Physical Exam: General: Pt awake/alert/oriented x4 in no major acute distress Eyes: PERRL, normal EOM. Sclera nonicteric Neuro: CN II-XII intact w/o focal sensory/motor deficits. Lymph: No head/neck/groin lymphadenopathy Psych:  No delerium/psychosis/paranoia HENT: Normocephalic, Mucus membranes moist.  No  thrush.  NG tube in place.  Has over a liter in the canister.  Output more thinly bili is now. Neck: Supple, No tracheal deviation Chest: No pain.  Good respiratory excursion. CV:  Pulses intact.  Heart rate and 100s.   EKG consistent with sinus tachycardia.  Regular rhythm Abdomen: Somewhat firm.  Moderately distended.  Nontender.  Left subcostal and right flank transverse incisions without hernias.  No umbilical hernia.  Mild diastases recti. GEN: Normal external male genitalia.  No inguinal hernias.  Ext:  SCDs BLE.  No significant edema.  No cyanosis Skin: No petechiae / purpurea.  No major sores Musculoskeletal: No severe joint pain.  Good ROM major joints   Results:   Labs: Results for orders placed or performed during the hospital encounter of 07/23/16 (from the past 48 hour(s))  Comprehensive metabolic panel     Status:  Abnormal   Collection Time: 07/23/16  8:30 AM  Result Value Ref Range   Sodium 133 (L) 135 - 145 mmol/L   Potassium 4.8 3.5 - 5.1 mmol/L   Chloride 97 (L) 101 - 111 mmol/L   CO2 23 22 - 32 mmol/L   Glucose, Bld 116 (H) 65 - 99 mg/dL   BUN 30 (H) 6 - 20 mg/dL   Creatinine, Ser 1.62 (H) 0.61 - 1.24 mg/dL   Calcium 8.9 8.9 - 10.3 mg/dL   Total Protein 6.7 6.5 - 8.1 g/dL   Albumin 2.9 (L) 3.5 - 5.0 g/dL   AST 49 (H) 15 - 41 U/L   ALT 51 17 - 63 U/L   Alkaline Phosphatase 100 38 - 126 U/L   Total Bilirubin 0.8 0.3 - 1.2 mg/dL   GFR calc non Af Amer 39 (L) >60 mL/min   GFR calc Af Amer 45 (L) >60 mL/min    Comment: (NOTE) The eGFR has been calculated using the CKD EPI equation. This calculation has not been validated in all clinical situations. eGFR's persistently <60 mL/min signify possible Chronic Kidney Disease.    Anion gap 13 5 - 15  CBC with Differential/Platelet     Status: Abnormal   Collection Time: 07/23/16  8:30 AM  Result Value Ref Range   WBC 15.1 (H) 4.0 - 10.5 K/uL   RBC 3.85 (L) 4.22 - 5.81 MIL/uL   Hemoglobin 10.2 (L) 13.0 - 17.0 g/dL    HCT 31.4 (L) 39.0 - 52.0 %   MCV 81.6 78.0 - 100.0 fL   MCH 26.5 26.0 - 34.0 pg   MCHC 32.5 30.0 - 36.0 g/dL   RDW 15.7 (H) 11.5 - 15.5 %   Platelets 550 (H) 150 - 400 K/uL   Neutrophils Relative % 90 %   Neutro Abs 13.6 (H) 1.7 - 7.7 K/uL   Lymphocytes Relative 4 %   Lymphs Abs 0.6 (L) 0.7 - 4.0 K/uL   Monocytes Relative 6 %   Monocytes Absolute 0.9 0.1 - 1.0 K/uL   Eosinophils Relative 0 %   Eosinophils Absolute 0.0 0.0 - 0.7 K/uL   Basophils Relative 0 %   Basophils Absolute 0.0 0.0 - 0.1 K/uL  Lipase, blood     Status: None   Collection Time: 07/23/16  8:30 AM  Result Value Ref Range   Lipase 29 11 - 51 U/L    Imaging / Studies: Ct Abdomen Pelvis W Contrast  Result Date: 07/23/2016 CLINICAL DATA:  N/V 2300 last pm, had AAS today showing possible obstruction, last bm 2 days ago, recent cervical spine surgery, pt in cervical collar, unable to raise arms d/t surgical restrictionsReduced dose per protocol, pt with partial left nephrectomy 01/25/16 for renal cancer, followed by nephrologistHx prostate cancer, colon cancer and renal cancer, colectomy EXAM: CT ABDOMEN AND PELVIS WITH CONTRAST TECHNIQUE: Multidetector CT imaging of the abdomen and pelvis was performed using the standard protocol following bolus administration of intravenous contrast. CONTRAST:  44m ISOVUE-300 IOPAMIDOL (ISOVUE-300) INJECTION 61% COMPARISON:  Radiographs from earlier the same day FINDINGS: Lower chest: Previous median sternotomy. Coronary calcifications. No pleural or pericardial effusion. Subpleural sub cm nodular opacities posteriorly in the visualized right lung base. Elevated left diaphragmatic leaflet with some adjacent subsegmental atelectasis at the left lung base. Hepatobiliary: Negative liver. Sub cm soft tissue attenuation density in the dependent aspect of the gallbladder lumen probably small calculi. Pancreas: Unremarkable. No pancreatic ductal dilatation or surrounding inflammatory changes.  Spleen: Normal in  size without focal abnormality. Adrenals/Urinary Tract: Normal adrenals. Normal right kidney. Focal parenchymal loss in the lower pole left kidney. No solid renal mass or hydronephrosis. No urolithiasis. Urinary bladder physiologically distended containing small amount of gas presumably from recent instrumentation. Stomach/Bowel: Stomach is mildly distended. Dilated proximal and mid small bowel loops without wall thickening. Abrupt transition to decompressed ileum in the right lower quadrant, with no associated mass or abscess. Distal ileum unremarkable. Probable partial right hemicolectomy. The colon is nondilated. Vascular/Lymphatic: Coarse aortoiliac arterial calcifications without aneurysm. No adenopathy localized. Reproductive: Mild prostatic enlargement with central coarse calcifications. Other: No ascites.  No free air. Musculoskeletal: No acute or significant osseous findings. IMPRESSION: 1. Distal small bowel obstruction without associated wall thickening, mass, hernia, or abscess suggesting adhesions as etiology. 2. Cholelithiasis 3. Aortoiliac atherosclerosis. Electronically Signed   By: Lucrezia Europe M.D.   On: 07/23/2016 10:22   Dg Abd Acute W/chest  Result Date: 07/23/2016 CLINICAL DATA:  Nausea/vomiting EXAM: DG ABDOMEN ACUTE W/ 1V CHEST COMPARISON:  Chest radiographs dated 03/01/2016 FINDINGS: Lungs are clear.  No pleural effusion or pneumothorax. Mild elevation left hemidiaphragm. The heart is normal in size. Postsurgical changes related to prior CABG. Median sternotomy. Multiple dilated loops of small bowel in the left mid abdomen, suspicious for small bowel obstruction. Surgical sutures in the right mid abdomen. No evidence of free air under the diaphragm on the upright view. Mild degenerative changes of the lumbar spine. IMPRESSION: No evidence of acute cardiopulmonary disease. Multiple dilated loops of small bowel in the left mid abdomen, suspicious for small bowel  obstruction. No free air. Electronically Signed   By: Julian Hy M.D.   On: 07/23/2016 09:26    Medications / Allergies: per chart  Antibiotics: Anti-infectives    None      Assessment  Clovis Cao  79 y.o. male       Problem List:  Principal Problem:   SBO (small bowel obstruction) (HCC) Active Problems:   Hypertension   Paroxysmal atrial fibrillation with RVR (HCC)   ARF (acute renal failure)    SBO, most likely from adhesions from prior surgery   Plan:  -med admit given complex health history -IVF -NGT -SBO protocol -Sinus tachycardia versus Afib management.  Ordered round-the-clock IV metoprolol.  Medicine evaluation. -hydrate for ARF -VTE prophylaxis- SCDs, etc -mobilize as tolerated to help recovery    Adin Hector, M.D., F.A.C.S. Gastrointestinal and Minimally Invasive Surgery [facility] [address], [place], [place] [address] [phone] Main / Paging   07/23/2016  Note: Portions of this report may have been transcribed using voice recognition software. Every effort was made to ensure accuracy; however, inadvertent computerized transcription errors may be present.   Any transcriptional errors that result from this process are unintentional.

## 2016-07-23 NOTE — ED Notes (Signed)
At 2nd blacking marking on tube

## 2016-07-23 NOTE — Progress Notes (Signed)
NG tube flushed with 176ml air.

## 2016-07-23 NOTE — ED Triage Notes (Signed)
Patient and family member states the patient developed nausea and vomiting last night around 2300.  States the patient has vomited every two hours, and describes the emesis as brown in color.  Patient also fell while getting into his chair and is concerned that he may have dislodged some of the hardware in his recent anterior and posterior neck surgery.  Denies loc.  Appetite poor; last BM 2 days ago.

## 2016-07-24 ENCOUNTER — Inpatient Hospital Stay (HOSPITAL_COMMUNITY): Payer: Medicare Other

## 2016-07-24 LAB — BASIC METABOLIC PANEL
Anion gap: 14 (ref 5–15)
BUN: 29 mg/dL — AB (ref 6–20)
CHLORIDE: 102 mmol/L (ref 101–111)
CO2: 22 mmol/L (ref 22–32)
Calcium: 8.6 mg/dL — ABNORMAL LOW (ref 8.9–10.3)
Creatinine, Ser: 1.32 mg/dL — ABNORMAL HIGH (ref 0.61–1.24)
GFR calc Af Amer: 58 mL/min — ABNORMAL LOW (ref >60)
GFR, EST NON AFRICAN AMERICAN: 50 mL/min — AB (ref >60)
GLUCOSE: 91 mg/dL (ref 65–99)
POTASSIUM: 4.3 mmol/L (ref 3.5–5.1)
Sodium: 138 mmol/L (ref 135–145)

## 2016-07-24 LAB — CBC
HEMATOCRIT: 29.3 % — AB (ref 39.0–52.0)
Hemoglobin: 9.7 g/dL — ABNORMAL LOW (ref 13.0–17.0)
MCH: 26.1 pg (ref 26.0–34.0)
MCHC: 33.1 g/dL (ref 30.0–36.0)
MCV: 79 fL (ref 78.0–100.0)
Platelets: 449 10*3/uL — ABNORMAL HIGH (ref 150–400)
RBC: 3.71 MIL/uL — ABNORMAL LOW (ref 4.22–5.81)
RDW: 16.1 % — AB (ref 11.5–15.5)
WBC: 9.3 10*3/uL (ref 4.0–10.5)

## 2016-07-24 LAB — MAGNESIUM: Magnesium: 2 mg/dL (ref 1.7–2.4)

## 2016-07-24 MED ORDER — HYDRALAZINE HCL 20 MG/ML IJ SOLN: 5.0000 mg | Freq: Four times a day (QID) | INTRAMUSCULAR | Status: DC | PRN

## 2016-07-24 MED ORDER — SODIUM CHLORIDE 0.45 % IV SOLN
INTRAVENOUS | Status: DC
  Administered 2016-07-24 – 2016-07-25 (×2): via INTRAVENOUS

## 2016-07-24 MED ORDER — METOPROLOL TARTRATE 5 MG/5ML IV SOLN
10.0000 mg | Freq: Four times a day (QID) | INTRAVENOUS | Status: DC
  Administered 2016-07-24 – 2016-07-25 (×6): 10 mg via INTRAVENOUS
  Filled 2016-07-24 (×7): qty 10

## 2016-07-24 NOTE — Progress Notes (Signed)
PROGRESS NOTE  Ricky Pierce [id number] DOB: 16-Apr-1936 DOA: 07/23/2016 PCP: Pcp Not In System  Hospital Course/Subjective: Ricky Pierce is a 80 y.o. male with medical history significant of paroxysmal atrial fibrillation, hypertension, hx of colon cancer, hx of prostate cancer, hx of renal cell carcinoma, CAD s/p CABG who had recent cervical fusion and has had n/v for one day diagnosed with SBO vs Ileus, now improved with NG tube in place and surgery following.   Assessment/Plan: Principal Problem:   SBO (small bowel obstruction) (HCC) Active Problems:   Hypertension   Paroxysmal atrial fibrillation with RVR (HCC)   ARF (acute renal failure)   Small bowel obstruction Likely secondary to adhesions from multiple intraabdominal surgeries, vs ileus from recent cervical fusion -NG tube -NPO -surgery following - KUB today with continued bowel dilation  Acute renal injury Likely secondary to dehydration -IVF -recheck BMP daily  Atrial fibrillation Tachycardia Patient is tachycardic. Regular rhythm on exam -EKG -metoprolol IV while NPO  Hypertension -metoprolol IV while NPO, increased to 10mg  Q6  CAD Hold statin and aspirin while NPO  DVT prophylaxis: Lovenox  Code Status: Full code  Family Communication: Wife at bedside  Disposition Plan: Likely discharge homein 2-3 days, was seen by PT 9/25.  Consults called: General surgery, Dr. Johney Maine  Admission status: Inpatient, medical floor  Objective: Vitals:   07/23/16 1852 07/23/16 2109 07/23/16 2347 07/24/16 0552  BP: (!) 138/46 (!) 109/58 (!) 130/54 (!) 152/68  Pulse: (!) 119 (!) 109 (!) 109 96  Resp: 18 18 16 16   Temp: 98.5 F (36.9 C) 98.6 F (37 C)  97.8 F (36.6 C)  TempSrc: Oral Oral  Oral  SpO2: 99% 98% 100% 100%  Weight:      Height:        Intake/Output Summary (Last 24 hours) at 07/24/16 1345 Last data filed at 07/24/16 1342  Gross per 24 hour  Intake           605.42 ml  Output              3025 ml  Net         -2419.58 ml   Filed Weights   07/23/16 0751 07/23/16 1801  Weight: 72.6 kg (160 lb) 70.5 kg (155 lb 6.8 oz)     Exam: General:  Alert, oriented, calm, in no acute distress HEENT: NG in place, 250cc of bilious material from 7am to 11am. Cardiovascular: RRR, no murmurs or rubs, no peripheral edema  Respiratory: clear to auscultation bilaterally, no wheezes, no crackles  Abdomen: soft, nontender, distended, normal bowel tones heard  Skin: dry, no rashes  Musculoskeletal: no joint effusions, normal range of motion  Psychiatric: appropriate affect, normal speech  Neurologic: extraocular muscles intact, clear speech, moving all extremities with intact sensorium    Data Reviewed: CBC:  Recent Labs Lab 07/23/16 0830 07/24/16 0416  WBC 15.1* 9.3  NEUTROABS 13.6*  --   HGB 10.2* 9.7*  HCT 31.4* 29.3*  MCV 81.6 79.0  PLT 550* 123XX123*   Basic Metabolic Panel:  Recent Labs Lab 07/23/16 0830 07/24/16 0416  NA 133* 138  K 4.8 4.3  CL 97* 102  CO2 23 22  GLUCOSE 116* 91  BUN 30* 29*  CREATININE 1.62* 1.32*  CALCIUM 8.9 8.6*  MG  --  2.0   GFR: Estimated Creatinine Clearance: 38.6 mL/min (by C-G formula based on SCr of 1.32 mg/dL (H)). Liver Function Tests:  Recent Labs Lab 07/23/16 0830  AST 49*  ALT 51  ALKPHOS 100  BILITOT 0.8  PROT 6.7  ALBUMIN 2.9*    Recent Labs Lab 07/23/16 0830  LIPASE 29   No results for input(s): AMMONIA in the last 168 hours. Coagulation Profile: No results for input(s): INR, PROTIME in the last 168 hours. Cardiac Enzymes: No results for input(s): CKTOTAL, CKMB, CKMBINDEX, TROPONINI in the last 168 hours. BNP (last 3 results) No results for input(s): PROBNP in the last 8760 hours. HbA1C: No results for input(s): HGBA1C in the last 72 hours. CBG: No results for input(s): GLUCAP in the last 168 hours. Lipid Profile: No results for input(s): CHOL, HDL, LDLCALC, TRIG, CHOLHDL, LDLDIRECT in the last 72  hours. Thyroid Function Tests: No results for input(s): TSH, T4TOTAL, FREET4, T3FREE, THYROIDAB in the last 72 hours. Anemia Panel: No results for input(s): VITAMINB12, FOLATE, FERRITIN, TIBC, IRON, RETICCTPCT in the last 72 hours. Urine analysis: No results found for: COLORURINE, APPEARANCEUR, LABSPEC, PHURINE, GLUCOSEU, HGBUR, BILIRUBINUR, KETONESUR, PROTEINUR, UROBILINOGEN, NITRITE, LEUKOCYTESUR Sepsis Labs: @LABRCNTIP (procalcitonin:4,lacticidven:4)  )No results found for this or any previous visit (from the past 240 hour(s)).   Studies: Dg Abd 2 Views  Result Date: 07/24/2016 CLINICAL DATA:  Follow-up small bowel obstruction 16 hours delay EXAM: ABDOMEN - 2 VIEW COMPARISON:  07/24/2016 at 1:28 a.m. FINDINGS: Persistent gaseous distended small bowel loops with multiple air-fluid levels consistent with small bowel obstruction. NG tube cold within proximal stomach. No free abdominal air. IMPRESSION: Gaseous distended small bowel loops with multiple air-fluid levels consistent with small bowel obstruction. Electronically Signed   By: Lahoma Crocker M.D.   On: 07/24/2016 10:04   Dg Abd Portable 1v-small Bowel Obstruction Protocol-initial, 8 Hr Delay  Result Date: 07/24/2016 CLINICAL DATA:  8 hours after administration of oral contrast. Small bowel obstruction protocol. Initial encounter. EXAM: PORTABLE ABDOMEN - 1 VIEW COMPARISON:  Abdominal radiograph performed 07/23/2016 FINDINGS: There is dilution of contrast into the small bowel. Contrast has not progressed to the colon, and diffusely dilated small bowel loops are again noted, measuring up to 4.2 cm, compatible with persistent obstruction. Contrast is also noted within the bladder. Postoperative change is seen at the right mid abdomen. No acute osseous abnormalities are seen. IMPRESSION: Dilution of contrast into the small bowel. No evidence of progression of contrast to the colon. Diffusely dilated small bowel loops again noted, measuring up to  4.2 cm, compatible with persistent obstruction. Electronically Signed   By: Garald Balding M.D.   On: 07/24/2016 02:09   Dg Abd Portable 1v-small Bowel Protocol-position Verification  Result Date: 07/23/2016 CLINICAL DATA:  Nasogastric tube placement.  Abdominal distention EXAM: PORTABLE ABDOMEN - 1 VIEW COMPARISON:  CT abdomen and pelvis earlier in the day FINDINGS: Nasogastric tube tip and side port are in the stomach. There are multiple loops of dilated bowel consistent with a degree of bowel obstruction. There are surgical clips the right abdomen. No free air evident. Contrast is seen in the urinary bladder. IMPRESSION: Persistent bowel dilatation in a pattern suggesting a degree of bowel obstruction. No free air. Nasogastric tube tip and side port in stomach. Note elevation of the left hemidiaphragm. Electronically Signed   By: Lowella Grip III M.D.   On: 07/23/2016 17:20    Scheduled Meds: . enoxaparin (LOVENOX) injection  40 mg Subcutaneous Q24H  . lip balm  1 application Topical BID  . metoprolol  10 mg Intravenous Q6H    Continuous Infusions: . sodium chloride    . lactated ringers 100 mL/hr at 07/23/16  2349     LOS: 1 day   Time spent: 21 minutes  Mir Marry Guan, MD [facility] Pager [phone]  If 7PM-7AM, please contact night-coverage [email or web address] Password [email or web address] 07/24/2016, 1:45 PM

## 2016-07-24 NOTE — Progress Notes (Signed)
Nurse flushed, both clear and blue ports, of NG tube with 154ml of air as ordered.

## 2016-07-24 NOTE — Progress Notes (Signed)
Subjective: No real change, he is recovering here in town with family.  He only takes percocet 1-2 times per day.  He has a hard time swallowing so usually only one pill.  He is passing some gas, no prior hx of SBO.  He has some early bed sores, and has been getting up at home.  Got sick Friday PM.     Objective: Vital signs in last 24 hours: Temp:  [97.4 F (36.3 C)-98.6 F (37 C)] 97.8 F (36.6 C) (09/25 0552) Pulse Rate:  [96-125] 96 (09/25 0552) Resp:  [16-20] 16 (09/25 0552) BP: (109-152)/(46-77) 152/68 (09/25 0552) SpO2:  [95 %-100 %] 100 % (09/25 0552) Weight:  [70.5 kg (155 lb 6.8 oz)-72.6 kg (160 lb)] 70.5 kg (155 lb 6.8 oz) (09/24 1801) Last BM Date: 07/21/16 NPO 1725 from NG Urine 850 "Other" 900 recorded Afebrile, Tachycardic Creatinine down to 1.32, H/H down also with IV fluids  8 hour protocol film: Dilution of contrast into the small bowel. No evidence of progression of contrast to the colon. Diffusely dilated small bowel loops again noted, measuring up to 4.2 cm, compatible with persistent obstruction. Intake/Output from previous day: 09/24 0701 - 09/25 0700 In: 1485.4 [I.V.:1485.4] Out: [id number] [Urine:850; Emesis/NG output:1725] Intake/Output this shift: No intake/output data recorded.  General appearance: alert, cooperative and no distress Resp: clear to auscultation bilaterally GI: distended, no bowel sounds, some flatus, he is up in the chair but not overly uncomfortable.  Lab Results:   Recent Labs  07/23/16 0830 07/24/16 0416  WBC 15.1* 9.3  HGB 10.2* 9.7*  HCT 31.4* 29.3*  PLT 550* 449*    BMET  Recent Labs  07/23/16 0830 07/24/16 0416  NA 133* 138  K 4.8 4.3  CL 97* 102  CO2 23 22  GLUCOSE 116* 91  BUN 30* 29*  CREATININE 1.62* 1.32*  CALCIUM 8.9 8.6*   PT/INR No results for input(s): LABPROT, INR in the last 72 hours.   Recent Labs Lab 07/23/16 0830  AST 49*  ALT 51  ALKPHOS 100  BILITOT 0.8  PROT 6.7  ALBUMIN 2.9*      Lipase     Component Value Date/Time   LIPASE 29 07/23/2016 0830     Studies/Results: Ct Abdomen Pelvis W Contrast  Result Date: 07/23/2016 CLINICAL DATA:  N/V 2300 last pm, had AAS today showing possible obstruction, last bm 2 days ago, recent cervical spine surgery, pt in cervical collar, unable to raise arms d/t surgical restrictionsReduced dose per protocol, pt with partial left nephrectomy 01/25/16 for renal cancer, followed by nephrologistHx prostate cancer, colon cancer and renal cancer, colectomy EXAM: CT ABDOMEN AND PELVIS WITH CONTRAST TECHNIQUE: Multidetector CT imaging of the abdomen and pelvis was performed using the standard protocol following bolus administration of intravenous contrast. CONTRAST:  51mL ISOVUE-300 IOPAMIDOL (ISOVUE-300) INJECTION 61% COMPARISON:  Radiographs from earlier the same day FINDINGS: Lower chest: Previous median sternotomy. Coronary calcifications. No pleural or pericardial effusion. Subpleural sub cm nodular opacities posteriorly in the visualized right lung base. Elevated left diaphragmatic leaflet with some adjacent subsegmental atelectasis at the left lung base. Hepatobiliary: Negative liver. Sub cm soft tissue attenuation density in the dependent aspect of the gallbladder lumen probably small calculi. Pancreas: Unremarkable. No pancreatic ductal dilatation or surrounding inflammatory changes. Spleen: Normal in size without focal abnormality. Adrenals/Urinary Tract: Normal adrenals. Normal right kidney. Focal parenchymal loss in the lower pole left kidney. No solid renal mass or hydronephrosis. No urolithiasis. Urinary bladder physiologically distended containing  small amount of gas presumably from recent instrumentation. Stomach/Bowel: Stomach is mildly distended. Dilated proximal and mid small bowel loops without wall thickening. Abrupt transition to decompressed ileum in the right lower quadrant, with no associated mass or abscess. Distal ileum  unremarkable. Probable partial right hemicolectomy. The colon is nondilated. Vascular/Lymphatic: Coarse aortoiliac arterial calcifications without aneurysm. No adenopathy localized. Reproductive: Mild prostatic enlargement with central coarse calcifications. Other: No ascites.  No free air. Musculoskeletal: No acute or significant osseous findings. IMPRESSION: 1. Distal small bowel obstruction without associated wall thickening, mass, hernia, or abscess suggesting adhesions as etiology. 2. Cholelithiasis 3. Aortoiliac atherosclerosis. Electronically Signed   By: Lucrezia Europe M.D.   On: 07/23/2016 10:22   Dg Abd Acute W/chest  Result Date: 07/23/2016 CLINICAL DATA:  Nausea/vomiting EXAM: DG ABDOMEN ACUTE W/ 1V CHEST COMPARISON:  Chest radiographs dated 03/01/2016 FINDINGS: Lungs are clear.  No pleural effusion or pneumothorax. Mild elevation left hemidiaphragm. The heart is normal in size. Postsurgical changes related to prior CABG. Median sternotomy. Multiple dilated loops of small bowel in the left mid abdomen, suspicious for small bowel obstruction. Surgical sutures in the right mid abdomen. No evidence of free air under the diaphragm on the upright view. Mild degenerative changes of the lumbar spine. IMPRESSION: No evidence of acute cardiopulmonary disease. Multiple dilated loops of small bowel in the left mid abdomen, suspicious for small bowel obstruction. No free air. Electronically Signed   By: Julian Hy M.D.   On: 07/23/2016 09:26   Dg Abd Portable 1v-small Bowel Obstruction Protocol-initial, 8 Hr Delay  Result Date: 07/24/2016 CLINICAL DATA:  8 hours after administration of oral contrast. Small bowel obstruction protocol. Initial encounter. EXAM: PORTABLE ABDOMEN - 1 VIEW COMPARISON:  Abdominal radiograph performed 07/23/2016 FINDINGS: There is dilution of contrast into the small bowel. Contrast has not progressed to the colon, and diffusely dilated small bowel loops are again noted, measuring  up to 4.2 cm, compatible with persistent obstruction. Contrast is also noted within the bladder. Postoperative change is seen at the right mid abdomen. No acute osseous abnormalities are seen. IMPRESSION: Dilution of contrast into the small bowel. No evidence of progression of contrast to the colon. Diffusely dilated small bowel loops again noted, measuring up to 4.2 cm, compatible with persistent obstruction. Electronically Signed   By: Garald Balding M.D.   On: 07/24/2016 02:09   Dg Abd Portable 1v-small Bowel Protocol-position Verification  Result Date: 07/23/2016 CLINICAL DATA:  Nasogastric tube placement.  Abdominal distention EXAM: PORTABLE ABDOMEN - 1 VIEW COMPARISON:  CT abdomen and pelvis earlier in the day FINDINGS: Nasogastric tube tip and side port are in the stomach. There are multiple loops of dilated bowel consistent with a degree of bowel obstruction. There are surgical clips the right abdomen. No free air evident. Contrast is seen in the urinary bladder. IMPRESSION: Persistent bowel dilatation in a pattern suggesting a degree of bowel obstruction. No free air. Nasogastric tube tip and side port in stomach. Note elevation of the left hemidiaphragm. Electronically Signed   By: Lowella Grip III M.D.   On: 07/23/2016 17:20   Prior to Admission medications   Medication Sig Start Date End Date Taking? Authorizing Provider  aspirin 81 MG chewable tablet Chew by mouth daily.   Yes Historical Provider, MD  cyclobenzaprine (FLEXERIL) 10 MG tablet Take 10 mg by mouth 2 (two) times daily.    Yes Historical Provider, MD  fexofenadine (ALLEGRA) 180 MG tablet Take 180 mg by mouth daily.  Yes Historical Provider, MD  lisinopril (PRINIVIL,ZESTRIL) 10 MG tablet Take 10 mg by mouth daily.   Yes Historical Provider, MD  metoprolol succinate (TOPROL-XL) 50 MG 24 hr tablet Take 50 mg by mouth 2 (two) times daily. Take with or immediately following a meal.   Yes Historical Provider, MD  montelukast  (SINGULAIR) 10 MG tablet Take 10 mg by mouth at bedtime.   Yes Historical Provider, MD  omeprazole (PRILOSEC) 20 MG capsule Take 20 mg by mouth 2 (two) times daily before a meal.   Yes Historical Provider, MD  oxyCODONE-acetaminophen (PERCOCET/ROXICET) 5-325 MG tablet Take by mouth every 8 (eight) hours as needed for severe pain.   Yes Historical Provider, MD  rosuvastatin (CRESTOR) 40 MG tablet Take 40 mg by mouth daily.   Yes Historical Provider, MD    Medications: . enoxaparin (LOVENOX) injection  40 mg Subcutaneous Q24H  . lip balm  1 application Topical BID  . metoprolol  5 mg Intravenous Q6H   . lactated ringers 100 mL/hr at 07/23/16 2349    Assessment/Plan SBO Hx of prior colectomy 1991, Hx of hernia repair, left partial nephrectomy 12/2015, cervical fusion, 07/12/2016 Atrial fibrillation Hypertension  Hx of CABG FEN: IV fluids/NPO ID:  No abx DVT:  Lovenox  Plan:  Continue NG suction, we are going to repeat film this AM. If no contrast may consider repeating SB protocol.  Ask PT and wound care to see and continue mobilization. Recheck labs in AM.      LOS: 1 day    Cherlynn Popiel 07/24/2016 [phone]

## 2016-07-24 NOTE — Evaluation (Signed)
Physical Therapy Evaluation Patient Details Name: Ricky Pierce MRN: [id number] DOB: [birth year] Today's Date: 07/24/2016   History of Present Illness  80 yo male admitted with SBO. Hx of cervical fusion 07/09/16-pt currently in hard collar.   Clinical Impression  On eval, pt was Min guard assist for mobility. He walked ~175 feet with a RW. Pt c/o "discomfort" in neck and abdomen. He tolerated activity well. Recommend daily ambulation with nursing supervision. Will follow.     Follow Up Recommendations Supervision - Intermittent    Equipment Recommendations  None recommended by PT    Recommendations for Other Services       Precautions / Restrictions Precautions Precautions: Cervical;Fall Precaution Comments: NG tube Required Braces or Orthoses: Cervical Brace Cervical Brace: Hard collar Restrictions Weight Bearing Restrictions: No      Mobility  Bed Mobility               General bed mobility comments: oob in recliner  Transfers Overall transfer level: Needs assistance Equipment used: Rolling walker (2 wheeled) Transfers: Sit to/from Stand Sit to Stand: Min guard         General transfer comment: close guard for safety  Ambulation/Gait Ambulation/Gait assistance: Min guard Ambulation Distance (Feet): 175 Feet Assistive device: Rolling walker (2 wheeled) Gait Pattern/deviations: Step-through pattern     General Gait Details: close guard for safety  Stairs            Wheelchair Mobility    Modified Rankin (Stroke Patients Only)       Balance                                             Pertinent Vitals/Pain Pain Assessment: 0-10 Pain Score: 2  Pain Location: neck, abdomen Pain Descriptors / Indicators: Discomfort Pain Intervention(s): Monitored during session    Home Living Family/patient expects to be discharged to:: Private residence Living Arrangements: Other (Comment) Available Help at Discharge: Friend(s) Type  of Home: House Home Access: Level entry     Home Layout: One level Home Equipment: Environmental consultant - 2 wheels;Bedside commode;Shower seat;Cane - single point      Prior Function Level of Independence: Needs assistance   Gait / Transfers Assistance Needed: using RW for ambulation most recently           Hand Dominance        Extremity/Trunk Assessment   Upper Extremity Assessment: Overall WFL for tasks assessed           Lower Extremity Assessment: Generalized weakness      Cervical / Trunk Assessment: Normal  Communication   Communication: No difficulties  Cognition Arousal/Alertness: Awake/alert Behavior During Therapy: WFL for tasks assessed/performed Overall Cognitive Status: Within Functional Limits for tasks assessed                      General Comments      Exercises     Assessment/Plan    PT Assessment Patient needs continued PT services  PT Problem List Decreased mobility;Decreased balance;Pain          PT Treatment Interventions DME instruction;Gait training;Functional mobility training;Therapeutic activities;Therapeutic exercise;Balance training;Patient/family education    PT Goals (Current goals can be found in the Care Plan section)  Acute Rehab PT Goals Patient Stated Goal: to get better PT Goal Formulation: With patient/family Time For Goal Achievement: 08/07/16 Potential to Achieve  Goals: Good    Frequency Min 3X/week   Barriers to discharge        Co-evaluation               End of Session Equipment Utilized During Treatment: Cervical collar;Gait belt Activity Tolerance: Patient tolerated treatment well Patient left: in chair;with call bell/phone within reach;with family/visitor present           Time: [id number] PT Time Calculation (min) (ACUTE ONLY): 12 min   Charges:   PT Evaluation $PT Eval Low Complexity: 1 Procedure     PT G Codes:        Ricky Pierce, MPT Pager: [phone]

## 2016-07-24 NOTE — Progress Notes (Signed)
General Surgery [facility]  Patient seen and examined.  Chart reviewed.  Wife at bedside.  Patient up in chair, comfortable.  NG tube, ice chips.  Wants to eat.  Abd protuberant, few BS present, non-tender.  AXR reviewed - diffuse air through small intestine and colon, mild dilatation  Suspect more of ileus pattern given recent surgery, anesthesia, narcotics, and inactivity.  Encouraged OOB, ambulation.  Limit narcotic use.  Continue NG decompression for now.  Will follow closely with you.  tmg  Earnstine Regal, MD, [facility] Office: [phone]

## 2016-07-24 NOTE — Progress Notes (Signed)
Nurse flushed, both clear and blue ports, of NG tube with 177ml of air as ordered.

## 2016-07-25 LAB — CBC
HCT: 28.6 % — ABNORMAL LOW (ref 39.0–52.0)
HEMOGLOBIN: 9.4 g/dL — AB (ref 13.0–17.0)
MCH: 26.4 pg (ref 26.0–34.0)
MCHC: 32.9 g/dL (ref 30.0–36.0)
MCV: 80.3 fL (ref 78.0–100.0)
PLATELETS: 442 10*3/uL — AB (ref 150–400)
RBC: 3.56 MIL/uL — AB (ref 4.22–5.81)
RDW: 15.9 % — ABNORMAL HIGH (ref 11.5–15.5)
WBC: 6.9 10*3/uL (ref 4.0–10.5)

## 2016-07-25 LAB — BASIC METABOLIC PANEL
ANION GAP: 13 (ref 5–15)
BUN: 19 mg/dL (ref 6–20)
CHLORIDE: 105 mmol/L (ref 101–111)
CO2: 21 mmol/L — AB (ref 22–32)
CREATININE: 1.03 mg/dL (ref 0.61–1.24)
Calcium: 8.7 mg/dL — ABNORMAL LOW (ref 8.9–10.3)
GFR calc non Af Amer: >60 mL/min (ref >60)
Glucose, Bld: 83 mg/dL (ref 65–99)
POTASSIUM: 4 mmol/L (ref 3.5–5.1)
SODIUM: 139 mmol/L (ref 135–145)

## 2016-07-25 MED ORDER — TEMAZEPAM 15 MG PO CAPS
15.0000 mg | ORAL_CAPSULE | Freq: Every evening | ORAL | Status: DC | PRN
  Administered 2016-07-25 – 2016-07-27 (×2): 15 mg via ORAL
  Filled 2016-07-25 (×2): qty 1

## 2016-07-25 MED ORDER — ACETAMINOPHEN 325 MG PO TABS
650.0000 mg | ORAL_TABLET | Freq: Four times a day (QID) | ORAL | Status: DC | PRN
  Administered 2016-07-26 – 2016-07-27 (×5): 650 mg via ORAL
  Filled 2016-07-25 (×6): qty 2

## 2016-07-25 MED ORDER — CYCLOBENZAPRINE HCL 10 MG PO TABS
10.0000 mg | ORAL_TABLET | Freq: Two times a day (BID) | ORAL | Status: DC
  Administered 2016-07-25 – 2016-07-27 (×4): 10 mg via ORAL
  Filled 2016-07-25 (×4): qty 1

## 2016-07-25 MED ORDER — METOPROLOL SUCCINATE ER 50 MG PO TB24
50.0000 mg | ORAL_TABLET | Freq: Two times a day (BID) | ORAL | Status: DC
  Administered 2016-07-25 – 2016-07-27 (×4): 50 mg via ORAL
  Filled 2016-07-25 (×4): qty 1

## 2016-07-25 NOTE — Progress Notes (Signed)
Physical Therapy Treatment Patient Details Name: Ricky Pierce MRN: [id number] DOB: Aug 25, 1936 Today's Date: 07/25/2016    History of Present Illness 80 yo male admitted with SBO. Hx of cervical fusion 07/09/16-pt currently in hard collar.     PT Comments    Progressing well with mobility. Pt reported he walked with family earlier today. Encouraged him to continue mobilizing as tolerated.   Follow Up Recommendations  Supervision - Intermittent     Equipment Recommendations  None recommended by PT    Recommendations for Other Services       Precautions / Restrictions Precautions Precautions: Cervical;Fall Precaution Comments: NG tube Required Braces or Orthoses: Cervical Brace Cervical Brace: Hard collar Restrictions Weight Bearing Restrictions: No    Mobility  Bed Mobility Overal bed mobility: Needs Assistance Bed Mobility: Supine to Sit     Supine to sit: Supervision        Transfers Overall transfer level: Needs assistance Equipment used: Rolling walker (2 wheeled) Transfers: Sit to/from Stand Sit to Stand: Min guard         General transfer comment: close guard for safety  Ambulation/Gait   Ambulation Distance (Feet): 300 Feet Assistive device: Rolling walker (2 wheeled) Gait Pattern/deviations: Step-through pattern     General Gait Details: close guard for safety   Stairs            Wheelchair Mobility    Modified Rankin (Stroke Patients Only)       Balance                                    Cognition Arousal/Alertness: Awake/alert Behavior During Therapy: WFL for tasks assessed/performed Overall Cognitive Status: Within Functional Limits for tasks assessed                      Exercises      General Comments        Pertinent Vitals/Pain Pain Assessment: No/denies pain    Home Living                      Prior Function            PT Goals (current goals can now be found in the care  plan section) Progress towards PT goals: Progressing toward goals    Frequency    Min 3X/week      PT Plan Current plan remains appropriate    Co-evaluation             End of Session Equipment Utilized During Treatment: Cervical collar Activity Tolerance: Patient tolerated treatment well Patient left: in chair;with call bell/phone within reach     Time: 0950-0958 PT Time Calculation (min) (ACUTE ONLY): 8 min  Charges:  $Gait Training: 8-22 mins                    G Codes:      Weston Anna, MPT Pager: [phone]

## 2016-07-25 NOTE — Progress Notes (Signed)
PROGRESS NOTE  Ricky Pierce [id number] DOB: 1936-05-13 DOA: 07/23/2016 PCP: Pcp Not In System  Hospital Course/Subjective: Ricky Pierce is a 80 y.o. male with medical history significant of paroxysmal atrial fibrillation, hypertension, hx of colon cancer, hx of prostate cancer, hx of renal cell carcinoma, CAD s/p CABG who had recent cervical fusion and has had n/v for one day diagnosed with SBO vs Ileus, now improved with NG tube in place and surgery following.   He has been ambulating, NG was clamped by surgery this AM. No pain, nausea. +flatus, no BM yet.  Assessment/Plan: Principal Problem:   SBO (small bowel obstruction) (HCC) Active Problems:   Hypertension   Paroxysmal atrial fibrillation with RVR (HCC)   ARF (acute renal failure)   Small bowel obstruction Likely secondary to adhesions from multiple intraabdominal surgeries, vs ileus from recent cervical fusion -NG tube clamped -NPO except ice chips -surgery following  Acute renal injury Likely secondary to dehydration, resolved. -IVF, stop when taking PO -recheck BMP daily  Atrial fibrillation Tachycardia Patient is tachycardic. Regular rhythm on exam -EKG -metoprolol IV while NPO  Hypertension -metoprolol IV while NPO, increased to 10mg  Q6 9/25  CAD Hold statin and aspirin while NPO  DVT prophylaxis: Lovenox  Code Status: Full code  Family Communication: None present this AM.  Disposition Plan: Likely discharge home within 48 hours. No needs per PT.  Consults called: General surgery, Dr. Johney Maine  Admission status: Inpatient, medical floor  Objective: Vitals:   07/24/16 0552 07/24/16 1430 07/24/16 2119 07/25/16 0509  BP: (!) 152/68 (!) 153/61 (!) 132/54 (!) 166/63  Pulse: 96 (!) 114 100 97  Resp: 16 16 16 16   Temp: 97.8 F (36.6 C) 97.5 F (36.4 C) 99 F (37.2 C) 97.7 F (36.5 C)  TempSrc: Oral Axillary Oral Oral  SpO2: 100%  99% 100%  Weight:      Height:        Intake/Output  Summary (Last 24 hours) at 07/25/16 1054 Last data filed at 07/25/16 1000  Gross per 24 hour  Intake          2784.16 ml  Output             1850 ml  Net           934.16 ml   Filed Weights   07/23/16 0751 07/23/16 1801  Weight: 72.6 kg (160 lb) 70.5 kg (155 lb 6.8 oz)     Exam: General:  Alert, oriented, calm, in no acute distress HEENT: NG in place,clamped. Cardiovascular: RRR, no murmurs or rubs, no peripheral edema  Respiratory: clear to auscultation bilaterally, no wheezes, no crackles  Abdomen: soft, nontender, distended, normal bowel tones heard  Skin: dry, no rashes  Musculoskeletal: no joint effusions, normal range of motion  Psychiatric: appropriate affect, normal speech  Neurologic: extraocular muscles intact, clear speech, moving all extremities with intact sensorium    Data Reviewed: CBC:  Recent Labs Lab 07/23/16 0830 07/24/16 0416 07/25/16 0514  WBC 15.1* 9.3 6.9  NEUTROABS 13.6*  --   --   HGB 10.2* 9.7* 9.4*  HCT 31.4* 29.3* 28.6*  MCV 81.6 79.0 80.3  PLT 550* 449* [id number]*   Basic Metabolic Panel:  Recent Labs Lab 07/23/16 0830 07/24/16 0416 07/25/16 0514  NA 133* 138 139  K 4.8 4.3 4.0  CL 97* 102 105  CO2 23 22 21*  GLUCOSE 116* 91 83  BUN 30* 29* 19  CREATININE 1.62* 1.32* 1.03  CALCIUM 8.9 8.6* 8.7*  MG  --  2.0  --    GFR: Estimated Creatinine Clearance: 49.5 mL/min (by C-G formula based on SCr of 1.03 mg/dL). Liver Function Tests:  Recent Labs Lab 07/23/16 0830  AST 49*  ALT 51  ALKPHOS 100  BILITOT 0.8  PROT 6.7  ALBUMIN 2.9*    Recent Labs Lab 07/23/16 0830  LIPASE 29   No results for input(s): AMMONIA in the last 168 hours. Coagulation Profile: No results for input(s): INR, PROTIME in the last 168 hours. Cardiac Enzymes: No results for input(s): CKTOTAL, CKMB, CKMBINDEX, TROPONINI in the last 168 hours. BNP (last 3 results) No results for input(s): PROBNP in the last 8760 hours. HbA1C: No results for input(s):  HGBA1C in the last 72 hours. CBG: No results for input(s): GLUCAP in the last 168 hours. Lipid Profile: No results for input(s): CHOL, HDL, LDLCALC, TRIG, CHOLHDL, LDLDIRECT in the last 72 hours. Thyroid Function Tests: No results for input(s): TSH, T4TOTAL, FREET4, T3FREE, THYROIDAB in the last 72 hours. Anemia Panel: No results for input(s): VITAMINB12, FOLATE, FERRITIN, TIBC, IRON, RETICCTPCT in the last 72 hours. Urine analysis: No results found for: COLORURINE, APPEARANCEUR, LABSPEC, PHURINE, GLUCOSEU, HGBUR, BILIRUBINUR, KETONESUR, PROTEINUR, UROBILINOGEN, NITRITE, LEUKOCYTESUR Sepsis Labs: @LABRCNTIP (procalcitonin:4,lacticidven:4)  )No results found for this or any previous visit (from the past 240 hour(s)).   Studies: No results found.  Scheduled Meds: . enoxaparin (LOVENOX) injection  40 mg Subcutaneous Q24H  . lip balm  1 application Topical BID  . metoprolol  10 mg Intravenous Q6H    Continuous Infusions: . sodium chloride 50 mL/hr at 07/25/16 0600  . lactated ringers 100 mL/hr at 07/23/16 2349     LOS: 2 days   Time spent: 26 minutes  Ruchy Wildrick Ricky Guan, MD [facility] Pager [phone]  If 7PM-7AM, please contact night-coverage [email or web address] Password [facility] 07/25/2016, 10:54 AM

## 2016-07-25 NOTE — Progress Notes (Signed)
No nausea, + BM, since this AM.  Abdomen soft and not distended.  NG discontinued.  Starting clear liquids.

## 2016-07-25 NOTE — Care Management Note (Signed)
Case Management Note  Patient Details  Name: Ricky Pierce MRN: [id number] Date of Birth: 07-15-1936  Subjective/Objective:                  N/V suspect ileus Action/Plan: Discharge planning Expected Discharge Date:  07/26/16               Expected Discharge Plan:  [place]  In-House Referral:  NA  Discharge planning Services  CM Consult  Post Acute Care Choice:  [place], Resumption of Svcs/PTA Provider Choice offered to:  Patient  DME Arranged:  N/A DME Agency:  NA  HH Arranged:  RN [place] Agency:  [place]  Status of Service:  In process, will continue to follow  If discussed at Long Length of Stay Meetings, dates discussed:    Additional Comments: CM met with pt in room to discuss message Cm received earlier from [facility] , Shirlean Mylar [phone].  Pt and spouuse of pt explained pt was receiving HHPT/RN services from [facility] from a Posterior Cervical laminectomy he underwent at [facility] 07/12/16.  Pt gave permission to call and discuss with Shirlean Mylar of [facility] status and discharge planning.  CM called Robin who requests upon discharge CM fax resumption orders for HHPT/RN to [phone] and notify her of discharge.  CM will follow for progression. Dellie Catholic, RN 07/25/2016, 1:04 PM

## 2016-07-25 NOTE — Progress Notes (Signed)
General Surgery [facility]  07/25/2016  Assessment & Plan: Small bowel obstruction, likely secondary to adhesions  Clamp NG tube this AM and monitor  NPO, ice chips, IVF  Limit narcotic use - patient has not requested  Ambulate - patient walked this AM        Earnstine Regal, MD, [facility]       Office: [phone]    Subjective: Patient in bed, no complaints.  Denies nausea or emesis.  Passing flatus, no BM. Ambulated this AM with wife.  Objective: Vital signs in last 24 hours: Temp:  [97.5 F (36.4 C)-99 F (37.2 C)] 97.7 F (36.5 C) (09/26 0509) Pulse Rate:  [97-114] 97 (09/26 0509) Resp:  [16] 16 (09/26 0509) BP: (132-166)/(54-63) 166/63 (09/26 0509) SpO2:  [99 %-100 %] 100 % (09/26 0509) Last BM Date: 07/21/16  Intake/Output from previous day: 09/25 0701 - 09/26 0700 In: 2704.2 [I.V.:2534.2; NG/GT:120; IV Piggyback:50] Out: 2100 [Urine:900; Emesis/NG output:1200] Intake/Output this shift: No intake/output data recorded.  Physical Exam: HEENT - sclerae clear, mucous membranes moist Neck - wound dry and intact; C-collar in place Chest - clear bilaterally Cor - RRR Abdomen - soft, mildly protuberant; non-tender; active BS present Ext - no edema, non-tender Neuro - alert & oriented, no focal deficits  Lab Results:   Recent Labs  07/24/16 0416 07/25/16 0514  WBC 9.3 6.9  HGB 9.7* 9.4*  HCT 29.3* 28.6*  PLT 449* 442*   BMET  Recent Labs  07/24/16 0416 07/25/16 0514  NA 138 139  K 4.3 4.0  CL 102 105  CO2 22 21*  GLUCOSE 91 83  BUN 29* 19  CREATININE 1.32* 1.03  CALCIUM 8.6* 8.7*   PT/INR No results for input(s): LABPROT, INR in the last 72 hours. Comprehensive Metabolic Panel:    Component Value Date/Time   NA 139 07/25/2016 0514   NA 138 07/24/2016 0416   K 4.0 07/25/2016 0514   K 4.3 07/24/2016 0416   CL 105 07/25/2016 0514   CL 102 07/24/2016 0416   CO2 21 (L) 07/25/2016 0514    CO2 22 07/24/2016 0416   BUN 19 07/25/2016 0514   BUN 29 (H) 07/24/2016 0416   CREATININE 1.03 07/25/2016 0514   CREATININE 1.32 (H) 07/24/2016 0416   GLUCOSE 83 07/25/2016 0514   GLUCOSE 91 07/24/2016 0416   CALCIUM 8.7 (L) 07/25/2016 0514   CALCIUM 8.6 (L) 07/24/2016 0416   AST 49 (H) 07/23/2016 0830   ALT 51 07/23/2016 0830   ALKPHOS 100 07/23/2016 0830   BILITOT 0.8 07/23/2016 0830   PROT 6.7 07/23/2016 0830   ALBUMIN 2.9 (L) 07/23/2016 0830    Studies/Results: Ct Abdomen Pelvis W Contrast  Result Date: 07/23/2016 CLINICAL DATA:  N/V 2300 last pm, had AAS today showing possible obstruction, last bm 2 days ago, recent cervical spine surgery, pt in cervical collar, unable to raise arms d/t surgical restrictionsReduced dose per protocol, pt with partial left nephrectomy 01/25/16 for renal cancer, followed by nephrologistHx prostate cancer, colon cancer and renal cancer, colectomy EXAM: CT ABDOMEN AND PELVIS WITH CONTRAST TECHNIQUE: Multidetector CT imaging of the abdomen and pelvis was performed using the standard protocol following bolus administration of intravenous contrast. CONTRAST:  76mL ISOVUE-300 IOPAMIDOL (ISOVUE-300) INJECTION 61% COMPARISON:  Radiographs from earlier the same day FINDINGS: Lower chest: Previous median sternotomy. Coronary calcifications. No pleural or pericardial effusion. Subpleural sub cm nodular opacities posteriorly in  the visualized right lung base. Elevated left diaphragmatic leaflet with some adjacent subsegmental atelectasis at the left lung base. Hepatobiliary: Negative liver. Sub cm soft tissue attenuation density in the dependent aspect of the gallbladder lumen probably small calculi. Pancreas: Unremarkable. No pancreatic ductal dilatation or surrounding inflammatory changes. Spleen: Normal in size without focal abnormality. Adrenals/Urinary Tract: Normal adrenals. Normal right kidney. Focal parenchymal loss in the lower pole left kidney. No solid renal  mass or hydronephrosis. No urolithiasis. Urinary bladder physiologically distended containing small amount of gas presumably from recent instrumentation. Stomach/Bowel: Stomach is mildly distended. Dilated proximal and mid small bowel loops without wall thickening. Abrupt transition to decompressed ileum in the right lower quadrant, with no associated mass or abscess. Distal ileum unremarkable. Probable partial right hemicolectomy. The colon is nondilated. Vascular/Lymphatic: Coarse aortoiliac arterial calcifications without aneurysm. No adenopathy localized. Reproductive: Mild prostatic enlargement with central coarse calcifications. Other: No ascites.  No free air. Musculoskeletal: No acute or significant osseous findings. IMPRESSION: 1. Distal small bowel obstruction without associated wall thickening, mass, hernia, or abscess suggesting adhesions as etiology. 2. Cholelithiasis 3. Aortoiliac atherosclerosis. Electronically Signed   By: Lucrezia Europe M.D.   On: 07/23/2016 10:22   Dg Abd 2 Views  Result Date: 07/24/2016 CLINICAL DATA:  Follow-up small bowel obstruction 16 hours delay EXAM: ABDOMEN - 2 VIEW COMPARISON:  07/24/2016 at 1:28 a.m. FINDINGS: Persistent gaseous distended small bowel loops with multiple air-fluid levels consistent with small bowel obstruction. NG tube cold within proximal stomach. No free abdominal air. IMPRESSION: Gaseous distended small bowel loops with multiple air-fluid levels consistent with small bowel obstruction. Electronically Signed   By: Lahoma Crocker M.D.   On: 07/24/2016 10:04   Dg Abd Portable 1v-small Bowel Obstruction Protocol-initial, 8 Hr Delay  Result Date: 07/24/2016 CLINICAL DATA:  8 hours after administration of oral contrast. Small bowel obstruction protocol. Initial encounter. EXAM: PORTABLE ABDOMEN - 1 VIEW COMPARISON:  Abdominal radiograph performed 07/23/2016 FINDINGS: There is dilution of contrast into the small bowel. Contrast has not progressed to the colon,  and diffusely dilated small bowel loops are again noted, measuring up to 4.2 cm, compatible with persistent obstruction. Contrast is also noted within the bladder. Postoperative change is seen at the right mid abdomen. No acute osseous abnormalities are seen. IMPRESSION: Dilution of contrast into the small bowel. No evidence of progression of contrast to the colon. Diffusely dilated small bowel loops again noted, measuring up to 4.2 cm, compatible with persistent obstruction. Electronically Signed   By: Garald Balding M.D.   On: 07/24/2016 02:09   Dg Abd Portable 1v-small Bowel Protocol-position Verification  Result Date: 07/23/2016 CLINICAL DATA:  Nasogastric tube placement.  Abdominal distention EXAM: PORTABLE ABDOMEN - 1 VIEW COMPARISON:  CT abdomen and pelvis earlier in the day FINDINGS: Nasogastric tube tip and side port are in the stomach. There are multiple loops of dilated bowel consistent with a degree of bowel obstruction. There are surgical clips the right abdomen. No free air evident. Contrast is seen in the urinary bladder. IMPRESSION: Persistent bowel dilatation in a pattern suggesting a degree of bowel obstruction. No free air. Nasogastric tube tip and side port in stomach. Note elevation of the left hemidiaphragm. Electronically Signed   By: Lowella Grip III M.D.   On: 07/23/2016 17:20      [place] M 07/25/2016  Patient ID: Ricky Pierce, male   DOB: 1936/07/30, 80 y.o.   MRN: [id number]

## 2016-07-26 DIAGNOSIS — I10 Essential (primary) hypertension: Secondary | ICD-10-CM

## 2016-07-26 DIAGNOSIS — N179 Acute kidney failure, unspecified: Secondary | ICD-10-CM

## 2016-07-26 DIAGNOSIS — K5669 Other intestinal obstruction: Secondary | ICD-10-CM

## 2016-07-26 LAB — CBC
HEMATOCRIT: 26.5 % — AB (ref 39.0–52.0)
HEMOGLOBIN: 8.4 g/dL — AB (ref 13.0–17.0)
MCH: 26.2 pg (ref 26.0–34.0)
MCHC: 31.7 g/dL (ref 30.0–36.0)
MCV: 82.6 fL (ref 78.0–100.0)
Platelets: 400 10*3/uL (ref 150–400)
RBC: 3.21 MIL/uL — AB (ref 4.22–5.81)
RDW: 15.7 % — ABNORMAL HIGH (ref 11.5–15.5)
WBC: 4.1 10*3/uL (ref 4.0–10.5)

## 2016-07-26 LAB — BASIC METABOLIC PANEL WITH GFR
Anion gap: 11 (ref 5–15)
BUN: 12 mg/dL (ref 6–20)
CO2: 23 mmol/L (ref 22–32)
Calcium: 8.1 mg/dL — ABNORMAL LOW (ref 8.9–10.3)
Chloride: 102 mmol/L (ref 101–111)
Creatinine, Ser: 0.87 mg/dL (ref 0.61–1.24)
GFR calc Af Amer: >60 mL/min (ref >60)
GFR calc non Af Amer: >60 mL/min (ref >60)
Glucose, Bld: 82 mg/dL (ref 65–99)
Potassium: 3.6 mmol/L (ref 3.5–5.1)
Sodium: 136 mmol/L (ref 135–145)

## 2016-07-26 LAB — PREALBUMIN: Prealbumin: 10.2 mg/dL — ABNORMAL LOW (ref 18–38)

## 2016-07-26 MED ORDER — ASPIRIN EC 81 MG PO TBEC
81.0000 mg | DELAYED_RELEASE_TABLET | Freq: Every day | ORAL | Status: DC
  Administered 2016-07-26 – 2016-07-27 (×2): 81 mg via ORAL
  Filled 2016-07-26 (×2): qty 1

## 2016-07-26 MED ORDER — ROSUVASTATIN CALCIUM 20 MG PO TABS
40.0000 mg | ORAL_TABLET | Freq: Every day | ORAL | Status: DC
  Administered 2016-07-26: 40 mg via ORAL
  Filled 2016-07-26: qty 2

## 2016-07-26 MED ORDER — LISINOPRIL 10 MG PO TABS
10.0000 mg | ORAL_TABLET | Freq: Every day | ORAL | Status: DC
  Administered 2016-07-26 – 2016-07-27 (×2): 10 mg via ORAL
  Filled 2016-07-26 (×2): qty 1

## 2016-07-26 NOTE — Progress Notes (Signed)
  Subjective: He is doing better, BM yesterday and this AM, both are a bit loose.  No distension and he feels better.  He is coughing some and sounds like he needs to get something up.   Objective: Vital signs in last 24 hours: Temp:  [98.1 F (36.7 C)-98.2 F (36.8 C)] 98.1 F (36.7 C) (09/27 0611) Pulse Rate:  [86-106] 89 (09/27 0611) Resp:  [14-16] 14 (09/27 0611) BP: (142-177)/(60-86) 155/60 (09/27 0611) SpO2:  [98 %-100 %] 98 % (09/27 0611) Last BM Date: 07/25/16 1630 Iv fluids 280 PO - clears Afebrile, VSS Labs OK, H/H down some No films Intake/Output from previous day: 09/26 0701 - 09/27 0700 In: 1630 [P.O.:480; I.V.:1150] Out: 951 [Urine:700; Emesis/NG output:250; Stool:1] Intake/Output this shift: No intake/output data recorded.  General appearance: alert, cooperative and no distress Resp: clear to auscultation bilaterally and cough is wet, but not coughing anything up.  GI: soft, not distended as before, + BS, and +BM  Lab Results:   Recent Labs  07/25/16 0514 07/26/16 0438  WBC 6.9 4.1  HGB 9.4* 8.4*  HCT 28.6* 26.5*  PLT 442* 400    BMET  Recent Labs  07/25/16 0514 07/26/16 0438  NA 139 136  K 4.0 3.6  CL 105 102  CO2 21* 23  GLUCOSE 83 82  BUN 19 12  CREATININE 1.03 0.87  CALCIUM 8.7* 8.1*   PT/INR No results for input(s): LABPROT, INR in the last 72 hours.   Recent Labs Lab 07/23/16 0830  AST 49*  ALT 51  ALKPHOS 100  BILITOT 0.8  PROT 6.7  ALBUMIN 2.9*     Lipase     Component Value Date/Time   LIPASE 29 07/23/2016 0830     Studies/Results: Dg Abd 2 Views  Result Date: 07/24/2016 CLINICAL DATA:  Follow-up small bowel obstruction 16 hours delay EXAM: ABDOMEN - 2 VIEW COMPARISON:  07/24/2016 at 1:28 a.m. FINDINGS: Persistent gaseous distended small bowel loops with multiple air-fluid levels consistent with small bowel obstruction. NG tube cold within proximal stomach. No free abdominal air. IMPRESSION: Gaseous distended  small bowel loops with multiple air-fluid levels consistent with small bowel obstruction. Electronically Signed   By: Lahoma Crocker M.D.   On: 07/24/2016 10:04    Medications: . cyclobenzaprine  10 mg Oral BID  . enoxaparin (LOVENOX) injection  40 mg Subcutaneous Q24H  . lip balm  1 application Topical BID  . metoprolol succinate  50 mg Oral BID   . sodium chloride 50 mL/hr at 07/26/16 0600  . lactated ringers 100 mL/hr at 07/23/16 2349   Assessment/Plan SBO vs ileus Hx of prior colectomy 1991, Hx of hernia repair, left partial nephrectomy 12/2015, cervical fusion, 07/12/2016 Atrial fibrillation Hypertension  Hx of CABG FEN: IV fluids/clears last PM =>full liquids today ID:  No abx DVT:  Lovenox   PLan:  Advance diet, continue to mobilize, IS, flutter valve and decrease IV fluids.    LOS: 3 days    [place]ali Sharma 07/26/2016 [phone]

## 2016-07-26 NOTE — Progress Notes (Signed)
PROGRESS NOTE    Ricky Pierce  [id number] DOB: [birth year] DOA: 07/23/2016 PCP: Pcp Not In System   Brief Narrative:Ricky Marshis a 80 y.o.malewith medical history significant of paroxysmal atrial fibrillation, hypertension, hx of colon cancer, hx of prostate cancer, hx of renal cell carcinoma, CAD s/p CABG who had recent cervical fusion and has had n/v for one day diagnosed with SBO vs Ileus,  improving with NG tube  and surgery following.  Now NG tube is out and advancing diet.   Assessment & Plan:   #  SBO (small bowel obstruction) vs ileus:  -Likely secondary to Addison's from multiple intra-abdominal surgeries versus ileus. Patient recently had cervical fusion surgery. -Obstruction seems improving with NG tube. The NG tube was removed and patient is able to tolerate clear liquid diet. Plan to advance diet to soft GI diet today. -Surgery consult appreciated. If patient able to tolerate diet well, he may be able to go home tomorrow with home care and outpatient follow-up. -Continue current medical and supportive care.  #Acute kidney injury likely hemodynamically mediated: Serum creatinine level already improved. I will discontinue IV fluid. Encourage oral intake, diet advanced.  # Essential Hypertension: Blood pressure elevated. Continue metoprolol. Resume lisinopril 10 mg today. It was held because of renal failure. Repeat BMP in the morning.  # Paroxysmal atrial fibrillation with RVR ([place]): Heart rate improving. Continue metoprolol. Resume aspirin and Crestor today. Continue to monitor.     Continue current medical and supportive care. Plan discussed with the patient at bedside.  DVT prophylaxis: Lovenox subcutaneous Code Status: Full Code Family Communication: No family present at the bedside Disposition Plan: Likely discharge home in 1-2 days  Consultants:   General surgery  Antimicrobials: Not on antibiotics  Subjective: Patient was seen and examined at bedside.  Reported feeling much better. He denied nausea, vomiting or abdominal pain. He reported tolerating clear diet. Denied chest pain or shortness of breath.  Objective: Vitals:   07/25/16 2021 07/25/16 2125 07/26/16 0611 07/26/16 0906  BP: (!) 156/63 (!) 142/64 (!) 155/60 (!) 163/58  Pulse: 86 95 89 95  Resp:  16 14   Temp:  98.2 F (36.8 C) 98.1 F (36.7 C)   TempSrc:  Oral Oral   SpO2:  98% 98%   Weight:      Height:        Intake/Output Summary (Last 24 hours) at 07/26/16 1123 Last data filed at 07/26/16 0927  Gross per 24 hour  Intake             1910 ml  Output              401 ml  Net             1509 ml   Filed Weights   07/23/16 0751 07/23/16 1801  Weight: 72.6 kg (160 lb) 70.5 kg (155 lb 6.8 oz)    Examination:  General exam: Appears calm and comfortable. He has neck collar Respiratory system: Clear to auscultation. Respiratory effort normal. Cardiovascular system: S1 & S2 heard, RRR. Trace bilateral pedal edema. Gastrointestinal system: Abdomen is nondistended, soft and nontender. Normal bowel sounds heard. Central nervous system: Alert and oriented. No focal neurological deficits. Extremities: Symmetric 5 x 5 power. Skin: No rashes, lesions or ulcers Psychiatry: Judgement and insight appear normal. Mood & affect appropriate.     Data Reviewed: I have personally reviewed following labs and imaging studies  CBC:  Recent Labs Lab 07/23/16 0830 07/24/16 0416 07/25/16 [id number]  07/26/16 0438  WBC 15.1* 9.3 6.9 4.1  NEUTROABS 13.6*  --   --   --   HGB 10.2* 9.7* 9.4* 8.4*  HCT 31.4* 29.3* 28.6* 26.5*  MCV 81.6 79.0 80.3 82.6  PLT 550* 449* 442* [id number]   Basic Metabolic Panel:  Recent Labs Lab 07/23/16 0830 07/24/16 0416 07/25/16 0514 07/26/16 0438  NA 133* 138 139 136  K 4.8 4.3 4.0 3.6  CL 97* 102 105 102  CO2 23 22 21* 23  GLUCOSE 116* 91 83 82  BUN 30* 29* 19 12  CREATININE 1.62* 1.32* 1.03 0.87  CALCIUM 8.9 8.6* 8.7* 8.1*  MG  --  2.0  --   --      GFR: Estimated Creatinine Clearance: 58.6 mL/min (by C-G formula based on SCr of 0.87 mg/dL). Liver Function Tests:  Recent Labs Lab 07/23/16 0830  AST 49*  ALT 51  ALKPHOS 100  BILITOT 0.8  PROT 6.7  ALBUMIN 2.9*    Recent Labs Lab 07/23/16 0830  LIPASE 29   No results for input(s): AMMONIA in the last 168 hours. Coagulation Profile: No results for input(s): INR, PROTIME in the last 168 hours. Cardiac Enzymes: No results for input(s): CKTOTAL, CKMB, CKMBINDEX, TROPONINI in the last 168 hours. BNP (last 3 results) No results for input(s): PROBNP in the last 8760 hours. HbA1C: No results for input(s): HGBA1C in the last 72 hours. CBG: No results for input(s): GLUCAP in the last 168 hours. Lipid Profile: No results for input(s): CHOL, HDL, LDLCALC, TRIG, CHOLHDL, LDLDIRECT in the last 72 hours. Thyroid Function Tests: No results for input(s): TSH, T4TOTAL, FREET4, T3FREE, THYROIDAB in the last 72 hours. Anemia Panel: No results for input(s): VITAMINB12, FOLATE, FERRITIN, TIBC, IRON, RETICCTPCT in the last 72 hours. Sepsis Labs: No results for input(s): PROCALCITON, LATICACIDVEN in the last 168 hours.  No results found for this or any previous visit (from the past 240 hour(s)).       Radiology Studies: No results found.      Scheduled Meds: . cyclobenzaprine  10 mg Oral BID  . enoxaparin (LOVENOX) injection  40 mg Subcutaneous Q24H  . lip balm  1 application Topical BID  . metoprolol succinate  50 mg Oral BID   Continuous Infusions:    LOS: 3 days    Time spent: 28 minutes    Dron Tanna Furry, MD [facility] Pager [phone]  If 7PM-7AM, please contact night-coverage [email or web address] Password [facility] 07/26/2016, 11:23 AM

## 2016-07-26 NOTE — Progress Notes (Signed)
Key Points: Use following P&T approved IV to PO non-antibiotic change policy.  Description contains the criteria that are approved Note: Policy Excludes:  Esophagectomy patientsPHARMACIST - PHYSICIAN COMMUNICATION DR:   CCS CONCERNING: IV to Oral Route Change Policy  RECOMMENDATION: This patient is receiving Benadryl by the intravenous route.  Based on criteria approved by the Pharmacy and Therapeutics Committee, the intravenous medication(s) is/are being converted to the equivalent oral dose form(s).   DESCRIPTION: These criteria include:  The patient is eating (either orally or via tube) and/or has been taking other orally administered medications for a least 24 hours  The patient has no evidence of active gastrointestinal bleeding or impaired GI absorption (gastrectomy, short bowel, patient on TNA or NPO).  If you have questions about this conversion, please contact the Pharmacy Department  []   [phone] )  [facility] []   [phone] )  [facility]  []   [phone] )  [facility] [x]   [phone] )  [place], [place], [facility] 07/26/2016 9:40 AM

## 2016-07-27 LAB — BASIC METABOLIC PANEL
Anion gap: 7 (ref 5–15)
BUN: 7 mg/dL (ref 6–20)
CALCIUM: 8.3 mg/dL — AB (ref 8.9–10.3)
CHLORIDE: 100 mmol/L — AB (ref 101–111)
CO2: 28 mmol/L (ref 22–32)
CREATININE: 0.83 mg/dL (ref 0.61–1.24)
GFR calc non Af Amer: >60 mL/min (ref >60)
GLUCOSE: 103 mg/dL — AB (ref 65–99)
Potassium: 3.4 mmol/L — ABNORMAL LOW (ref 3.5–5.1)
Sodium: 135 mmol/L (ref 135–145)

## 2016-07-27 MED ORDER — POTASSIUM CHLORIDE CRYS ER 20 MEQ PO TBCR
40.0000 meq | EXTENDED_RELEASE_TABLET | Freq: Once | ORAL | Status: AC
  Administered 2016-07-27: 40 meq via ORAL
  Filled 2016-07-27 (×2): qty 2

## 2016-07-27 NOTE — Progress Notes (Signed)
Pt was discharged home today. Instructions were reviewed with patient, and questions were answered. Pt was taken to main entrance via wheelchair by NT.  [place]

## 2016-07-27 NOTE — Progress Notes (Signed)
Patient ID: Ricky Pierce, male   DOB: [birth year], 80 y.o.   MRN: [id number]  [facility] Surgery Progress Note     Subjective: Feeling well this morning. Denies any abdominal pain, nausea, or vomiting. Had 2 BM's yesterday. States that he is ready to go home.  Objective: Vital signs in last 24 hours: Temp:  [97.9 F (36.6 C)-99.2 F (37.3 C)] 97.9 F (36.6 C) (09/28 0450) Pulse Rate:  [91-102] 91 (09/28 0450) Resp:  [14-20] 20 (09/28 0450) BP: (122-166)/(50-70) 135/61 (09/28 0450) SpO2:  [96 %-97 %] 97 % (09/28 0450) Last BM Date: 07/26/16  Intake/Output from previous day: 09/27 0701 - 09/28 0700 In: 1115 [P.O.:840; I.V.:275] Out: 400 [Urine:400] Intake/Output this shift: No intake/output data recorded.  PE: General appearance: alert, cooperative and no distress Resp: clear to auscultation bilaterally and cough is wet, but not coughing anything up.  GI: soft, ND/NT, + BS  Lab Results:   Recent Labs  07/25/16 0514 07/26/16 0438  WBC 6.9 4.1  HGB 9.4* 8.4*  HCT 28.6* 26.5*  PLT 442* 400   BMET  Recent Labs  07/26/16 0438 07/27/16 0437  NA 136 135  K 3.6 3.4*  CL 102 100*  CO2 23 28  GLUCOSE 82 103*  BUN 12 7  CREATININE 0.87 0.83  CALCIUM 8.1* 8.3*   PT/INR No results for input(s): LABPROT, INR in the last 72 hours. CMP     Component Value Date/Time   NA 135 07/27/2016 0437   K 3.4 (L) 07/27/2016 0437   CL 100 (L) 07/27/2016 0437   CO2 28 07/27/2016 0437   GLUCOSE 103 (H) 07/27/2016 0437   BUN 7 07/27/2016 0437   CREATININE 0.83 07/27/2016 0437   CALCIUM 8.3 (L) 07/27/2016 0437   PROT 6.7 07/23/2016 0830   ALBUMIN 2.9 (L) 07/23/2016 0830   AST 49 (H) 07/23/2016 0830   ALT 51 07/23/2016 0830   ALKPHOS 100 07/23/2016 0830   BILITOT 0.8 07/23/2016 0830   GFRNONAA >60 07/27/2016 0437   GFRAA >60 07/27/2016 0437   Lipase     Component Value Date/Time   LIPASE 29 07/23/2016 0830       Studies/Results: No results  found.  Anti-infectives: Anti-infectives    None       Assessment/Plan SBO vs ileus - Hx of prior colectomy 1991, Hx of hernia repair, left partial nephrectomy 12/2015, cervical fusion, 07/12/2016 - recent cervical fusion  - resolving  hx of colon cancer hx of prostate cancer hx of renal cell carcinoma Atrial fibrillation Hypertension  Hx of CABG  FEN: soft ID: No abx DVT: Lovenox   PLan:  Tolerating diet, had 2 BM's yesterday, SBO/ileus resolving. Continue to mobilize and drink fluids. Encouraged IS. Patient is ready for discharge from a general surgery standpoint, we will sign off.   LOS: 4 days    Jerrye Beavers , [facility] 07/27/2016, 7:23 AM Pager: [phone] Consults: [phone] Mon-Fri 7:00 am-4:30 pm Sat-Sun 7:00 am-11:30 am

## 2016-07-27 NOTE — Progress Notes (Signed)
CM called [facility] to notify of pt discharge.  Per [facility] request, facesheet, face to face, orders for resumption of HHRN/PT, H&P, DC summary faxed to [phone].  No other CM needs were communicated.

## 2016-07-27 NOTE — Discharge Summary (Signed)
Physician Discharge Summary  Ricky Pierce [id number] DOB: [birth year] DOA: 07/23/2016  PCP: Pcp Not In System  Admit date: 07/23/2016 Discharge date: 07/27/2016  Admitted From: Home  Disposition:  Home with home care Recommendations for Outpatient Follow-up:  1. Follow up with PCP in 1-2 weeks 2. Please obtain BMP/CBC in one week  Home Health:yes Equipment/Devices:no  Discharge Condition:stable CODE STATUS:full Diet recommendation:heart healthy   Brief/Interim Summary:Ricky Marshis a 79 y.o.malewith medical history significant of paroxysmal atrial fibrillation, hypertension, hx of colon cancer, hx of prostate cancer, hx of renal cell carcinoma, CAD s/p CABG who had recent cervical fusion and has had n/v for one day.  Patient was admitted with diagnosis of small bowel obstruction versus ileus. It is likely secondary due to adhesion from multiple intra-abdominal surgeries. Patient was evaluated by surgery team. NG tube was placed with clinical improvement. The NG tube is out. The patient is able to tolerate diet without difficulties. He had a bowel movement. The patient is eager to go home today.  In the hospital patient developed acute kidney injury likely hemodynamically mediated. Serum creatinine level improved on discharge. He was managed in the hospital for essential hypertension and atrial fibrillation with RVR. Resume home medications. I advised patient to follow-up with PCP and his surgeon as an outpatient. Patient reported that he follows with his PCP at different hospital.  I recommended patient to check lab next week to monitor his electrolytes. Patient is medically stable on discharge. Patient's wife at bedside.  Discharge Diagnoses:  Principal Problem:   SBO (small bowel obstruction) (HCC) Active Problems:   Essential hypertension   Paroxysmal atrial fibrillation with RVR (HCC)   ARF (acute renal failure)     Discharge Instructions  Discharge Instructions    Call  MD for:  difficulty breathing, headache or visual disturbances    Complete by:  As directed    Call MD for:  persistant nausea and vomiting    Complete by:  As directed    Call MD for:  temperature >100.4    Complete by:  As directed    Diet - low sodium heart healthy    Complete by:  As directed    Diet - low sodium heart healthy    Complete by:  As directed    Increase activity slowly    Complete by:  As directed    Increase activity slowly    Complete by:  As directed        Medication List    TAKE these medications   aspirin 81 MG chewable tablet Chew by mouth daily.   cyclobenzaprine 10 MG tablet Commonly known as:  FLEXERIL Take 10 mg by mouth 2 (two) times daily.   fexofenadine 180 MG tablet Commonly known as:  ALLEGRA Take 180 mg by mouth daily.   lisinopril 10 MG tablet Commonly known as:  PRINIVIL,ZESTRIL Take 10 mg by mouth daily.   metoprolol succinate 50 MG 24 hr tablet Commonly known as:  TOPROL-XL Take 50 mg by mouth 2 (two) times daily. Take with or immediately following a meal.   montelukast 10 MG tablet Commonly known as:  SINGULAIR Take 10 mg by mouth at bedtime.   omeprazole 20 MG capsule Commonly known as:  PRILOSEC Take 20 mg by mouth 2 (two) times daily before a meal.   oxyCODONE-acetaminophen 5-325 MG tablet Commonly known as:  PERCOCET/ROXICET Take by mouth every 8 (eight) hours as needed for severe pain.   rosuvastatin 40 MG tablet Commonly known  as:  CRESTOR Take 40 mg by mouth daily.      Follow-up Information    [facility]. Schedule an appointment as soon as possible for a visit in 2 week(s).   Specialty:  General Surgery Contact information: [address] [place] [address] [phone]          Allergies  Allergen Reactions  . Sulfa Antibiotics Hives    Consultations:  surgery   Procedures/Studies: Ct Abdomen Pelvis W Contrast  Result Date: 07/23/2016 CLINICAL DATA:  N/V 2300  last pm, had AAS today showing possible obstruction, last bm 2 days ago, recent cervical spine surgery, pt in cervical collar, unable to raise arms d/t surgical restrictionsReduced dose per protocol, pt with partial left nephrectomy 01/25/16 for renal cancer, followed by nephrologistHx prostate cancer, colon cancer and renal cancer, colectomy EXAM: CT ABDOMEN AND PELVIS WITH CONTRAST TECHNIQUE: Multidetector CT imaging of the abdomen and pelvis was performed using the standard protocol following bolus administration of intravenous contrast. CONTRAST:  40mL ISOVUE-300 IOPAMIDOL (ISOVUE-300) INJECTION 61% COMPARISON:  Radiographs from earlier the same day FINDINGS: Lower chest: Previous median sternotomy. Coronary calcifications. No pleural or pericardial effusion. Subpleural sub cm nodular opacities posteriorly in the visualized right lung base. Elevated left diaphragmatic leaflet with some adjacent subsegmental atelectasis at the left lung base. Hepatobiliary: Negative liver. Sub cm soft tissue attenuation density in the dependent aspect of the gallbladder lumen probably small calculi. Pancreas: Unremarkable. No pancreatic ductal dilatation or surrounding inflammatory changes. Spleen: Normal in size without focal abnormality. Adrenals/Urinary Tract: Normal adrenals. Normal right kidney. Focal parenchymal loss in the lower pole left kidney. No solid renal mass or hydronephrosis. No urolithiasis. Urinary bladder physiologically distended containing small amount of gas presumably from recent instrumentation. Stomach/Bowel: Stomach is mildly distended. Dilated proximal and mid small bowel loops without wall thickening. Abrupt transition to decompressed ileum in the right lower quadrant, with no associated mass or abscess. Distal ileum unremarkable. Probable partial right hemicolectomy. The colon is nondilated. Vascular/Lymphatic: Coarse aortoiliac arterial calcifications without aneurysm. No adenopathy localized.  Reproductive: Mild prostatic enlargement with central coarse calcifications. Other: No ascites.  No free air. Musculoskeletal: No acute or significant osseous findings. IMPRESSION: 1. Distal small bowel obstruction without associated wall thickening, mass, hernia, or abscess suggesting adhesions as etiology. 2. Cholelithiasis 3. Aortoiliac atherosclerosis. Electronically Signed   By: Lucrezia Europe M.D.   On: 07/23/2016 10:22   Dg Abd 2 Views  Result Date: 07/24/2016 CLINICAL DATA:  Follow-up small bowel obstruction 16 hours delay EXAM: ABDOMEN - 2 VIEW COMPARISON:  07/24/2016 at 1:28 a.m. FINDINGS: Persistent gaseous distended small bowel loops with multiple air-fluid levels consistent with small bowel obstruction. NG tube cold within proximal stomach. No free abdominal air. IMPRESSION: Gaseous distended small bowel loops with multiple air-fluid levels consistent with small bowel obstruction. Electronically Signed   By: Lahoma Crocker M.D.   On: 07/24/2016 10:04   Dg Abd Acute W/chest  Result Date: 07/23/2016 CLINICAL DATA:  Nausea/vomiting EXAM: DG ABDOMEN ACUTE W/ 1V CHEST COMPARISON:  Chest radiographs dated 03/01/2016 FINDINGS: Lungs are clear.  No pleural effusion or pneumothorax. Mild elevation left hemidiaphragm. The heart is normal in size. Postsurgical changes related to prior CABG. Median sternotomy. Multiple dilated loops of small bowel in the left mid abdomen, suspicious for small bowel obstruction. Surgical sutures in the right mid abdomen. No evidence of free air under the diaphragm on the upright view. Mild degenerative changes of the lumbar spine. IMPRESSION: No  evidence of acute cardiopulmonary disease. Multiple dilated loops of small bowel in the left mid abdomen, suspicious for small bowel obstruction. No free air. Electronically Signed   By: Julian Hy M.D.   On: 07/23/2016 09:26   Dg Abd Portable 1v-small Bowel Obstruction Protocol-initial, 8 Hr Delay  Result Date:  07/24/2016 CLINICAL DATA:  8 hours after administration of oral contrast. Small bowel obstruction protocol. Initial encounter. EXAM: PORTABLE ABDOMEN - 1 VIEW COMPARISON:  Abdominal radiograph performed 07/23/2016 FINDINGS: There is dilution of contrast into the small bowel. Contrast has not progressed to the colon, and diffusely dilated small bowel loops are again noted, measuring up to 4.2 cm, compatible with persistent obstruction. Contrast is also noted within the bladder. Postoperative change is seen at the right mid abdomen. No acute osseous abnormalities are seen. IMPRESSION: Dilution of contrast into the small bowel. No evidence of progression of contrast to the colon. Diffusely dilated small bowel loops again noted, measuring up to 4.2 cm, compatible with persistent obstruction. Electronically Signed   By: Garald Balding M.D.   On: 07/24/2016 02:09   Dg Abd Portable 1v-small Bowel Protocol-position Verification  Result Date: 07/23/2016 CLINICAL DATA:  Nasogastric tube placement.  Abdominal distention EXAM: PORTABLE ABDOMEN - 1 VIEW COMPARISON:  CT abdomen and pelvis earlier in the day FINDINGS: Nasogastric tube tip and side port are in the stomach. There are multiple loops of dilated bowel consistent with a degree of bowel obstruction. There are surgical clips the right abdomen. No free air evident. Contrast is seen in the urinary bladder. IMPRESSION: Persistent bowel dilatation in a pattern suggesting a degree of bowel obstruction. No free air. Nasogastric tube tip and side port in stomach. Note elevation of the left hemidiaphragm. Electronically Signed   By: Lowella Grip III M.D.   On: 07/23/2016 17:20     Subjective:   Discharge Exam: Vitals:   07/27/16 0450 07/27/16 0840  BP: 135/61 131/62  Pulse: 91 99  Resp: 20 20  Temp: 97.9 F (36.6 C) 98 F (36.7 C)   Vitals:   07/26/16 1416 07/26/16 2058 07/27/16 0450 07/27/16 0840  BP: (!) 133/50 122/65 135/61 131/62  Pulse: (!) 102  95 91 99  Resp: 14 14 20 20   Temp: 99.2 F (37.3 C) 98.8 F (37.1 C) 97.9 F (36.6 C) 98 F (36.7 C)  TempSrc: Oral Oral Oral Oral  SpO2:  96% 97% 99%  Weight:      Height:        General: Pt is alert, awake, not in acute distress. He has neck collar Cardiovascular: RRR, S1/S2 +, no rubs, no gallops Respiratory: CTA bilaterally, no wheezing, no rhonchi Abdominal: Soft, NT, ND, bowel sounds + Extremities: no edema, no cyanosis Alert, awake and following commands.   The results of significant diagnostics from this hospitalization (including imaging, microbiology, ancillary and laboratory) are listed below for reference.     Microbiology: No results found for this or any previous visit (from the past 240 hour(s)).   Labs: BNP (last 3 results) No results for input(s): BNP in the last 8760 hours. Basic Metabolic Panel:  Recent Labs Lab 07/23/16 0830 07/24/16 0416 07/25/16 0514 07/26/16 0438 07/27/16 0437  NA 133* 138 139 136 135  K 4.8 4.3 4.0 3.6 3.4*  CL 97* 102 105 102 100*  CO2 23 22 21* 23 28  GLUCOSE 116* 91 83 82 103*  BUN 30* 29* 19 12 7   CREATININE 1.62* 1.32* 1.03 0.87 0.83  CALCIUM  8.9 8.6* 8.7* 8.1* 8.3*  MG  --  2.0  --   --   --    Liver Function Tests:  Recent Labs Lab 07/23/16 0830  AST 49*  ALT 51  ALKPHOS 100  BILITOT 0.8  PROT 6.7  ALBUMIN 2.9*    Recent Labs Lab 07/23/16 0830  LIPASE 29   No results for input(s): AMMONIA in the last 168 hours. CBC:  Recent Labs Lab 07/23/16 0830 07/24/16 0416 07/25/16 0514 07/26/16 0438  WBC 15.1* 9.3 6.9 4.1  NEUTROABS 13.6*  --   --   --   HGB 10.2* 9.7* 9.4* 8.4*  HCT 31.4* 29.3* 28.6* 26.5*  MCV 81.6 79.0 80.3 82.6  PLT 550* 449* 442* 400   Cardiac Enzymes: No results for input(s): CKTOTAL, CKMB, CKMBINDEX, TROPONINI in the last 168 hours. BNP: Invalid input(s): POCBNP CBG: No results for input(s): GLUCAP in the last 168 hours. D-Dimer No results for input(s): DDIMER in the  last 72 hours. Hgb A1c No results for input(s): HGBA1C in the last 72 hours. Lipid Profile No results for input(s): CHOL, HDL, LDLCALC, TRIG, CHOLHDL, LDLDIRECT in the last 72 hours. Thyroid function studies No results for input(s): TSH, T4TOTAL, T3FREE, THYROIDAB in the last 72 hours.  Invalid input(s): FREET3 Anemia work up No results for input(s): VITAMINB12, FOLATE, FERRITIN, TIBC, IRON, RETICCTPCT in the last 72 hours. Urinalysis No results found for: COLORURINE, APPEARANCEUR, LABSPEC, [place], GLUCOSEU, HGBUR, BILIRUBINUR, KETONESUR, PROTEINUR, UROBILINOGEN, NITRITE, LEUKOCYTESUR Sepsis Labs Invalid input(s): PROCALCITONIN,  WBC,  LACTICIDVEN Microbiology No results found for this or any previous visit (from the past 240 hour(s)).   Time coordinating discharge: 28 minutes  SIGNED:   Rosita Fire, MD  [facility] 07/27/2016, 11:07 AM Pager   If 7PM-7AM, please contact night-coverage [email or web address] Password [email or web address]

## 2018-03-26 IMAGING — CR DG ABDOMEN 2V
1 series · 1 of 1 positions shown · non-contrast
Comparison: 07/24/2016 at [DATE] a.m.

CLINICAL DATA: Follow-up small bowel obstruction 16 hours delay

EXAM:
ABDOMEN - 2 VIEW

[w abdomen decub *]
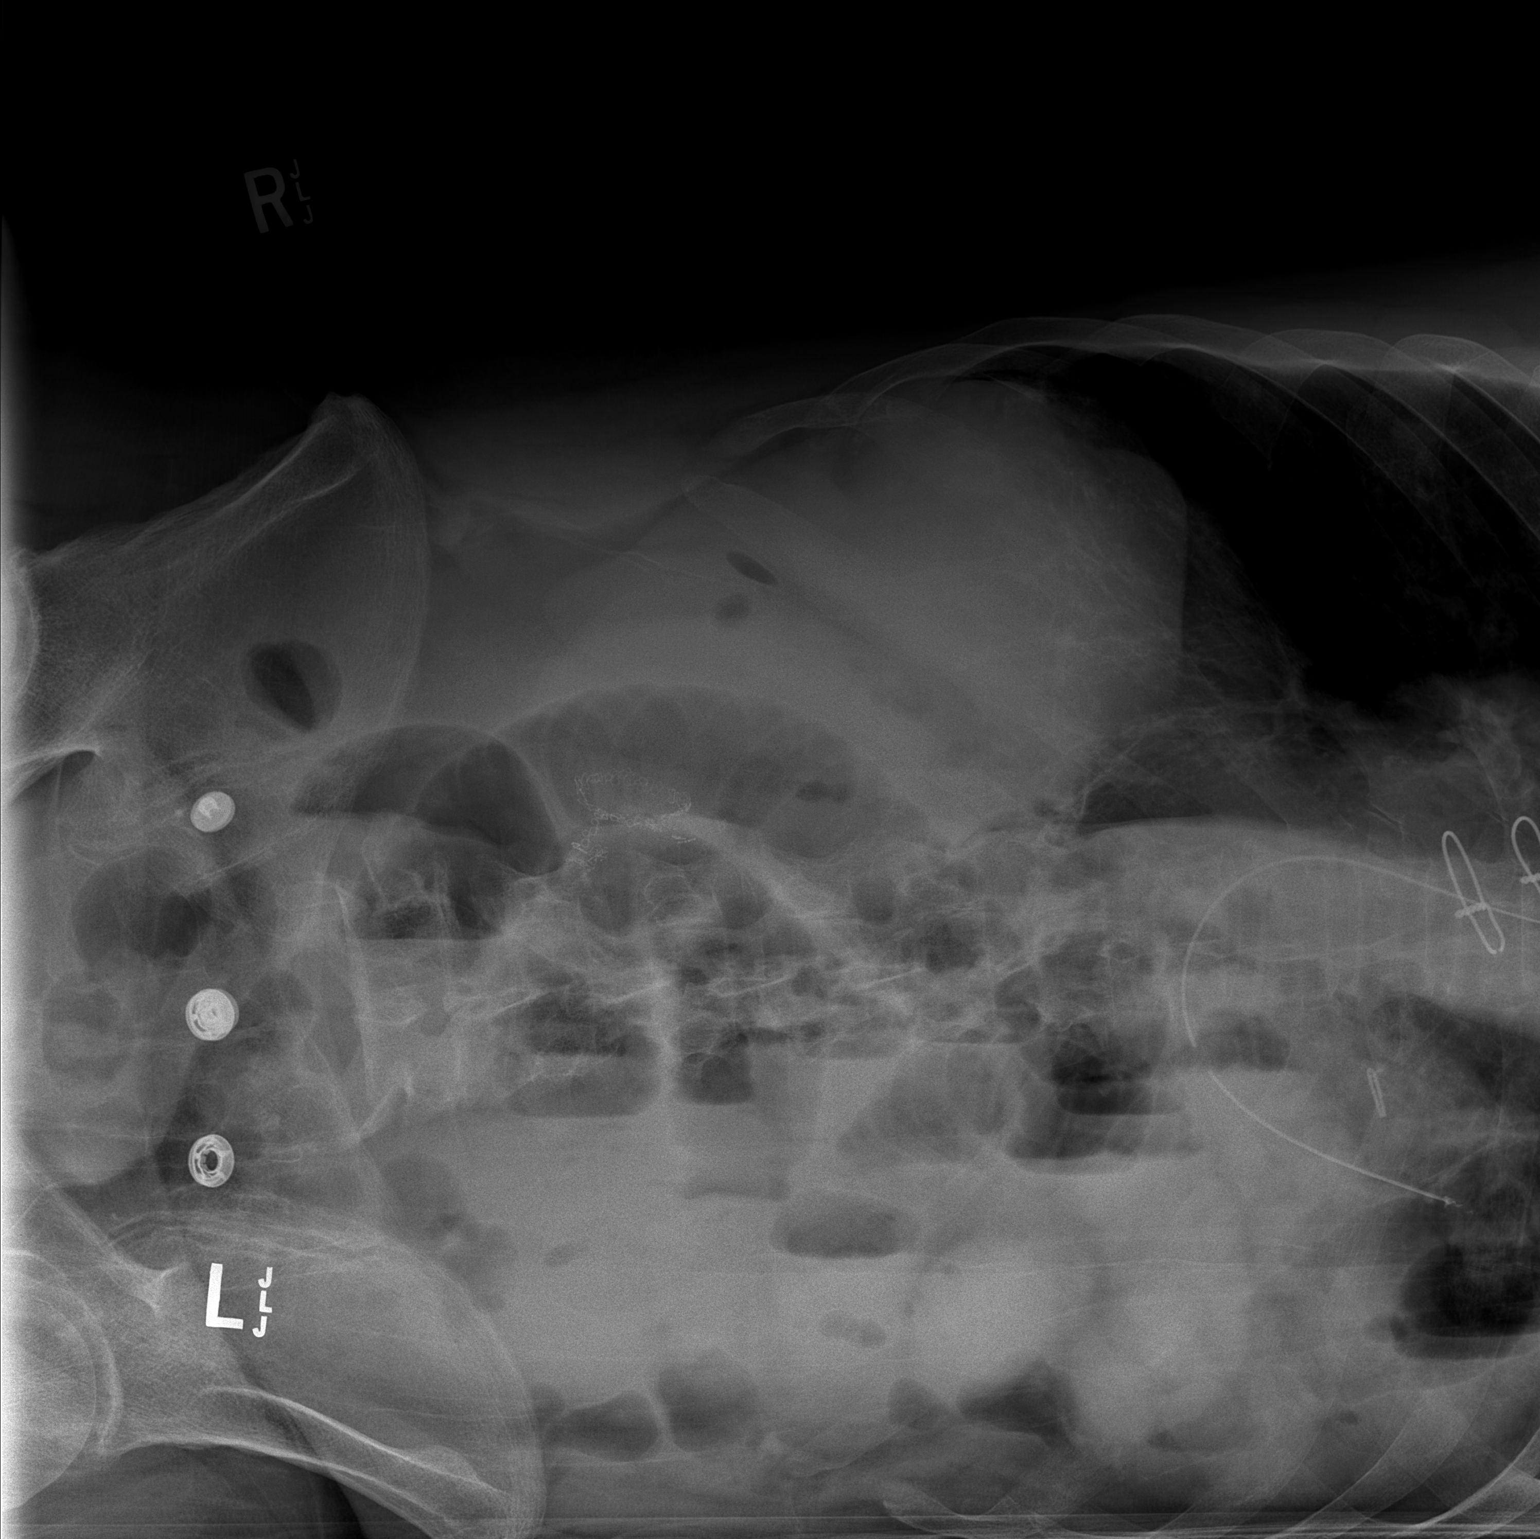

[1 of 1 positions shown; findings below may reference images not displayed]

FINDINGS: Persistent gaseous distended small bowel loops with multiple
air-fluid levels consistent with small bowel obstruction. NG tube
cold within proximal stomach. No free abdominal air.
IMPRESSION: Gaseous distended small bowel loops with multiple air-fluid levels
consistent with small bowel obstruction.

## 2019-12-14 ENCOUNTER — Ambulatory Visit: Payer: Medicare Other
